# Patient Record
Sex: Male | Born: 1955 | Race: White | Hispanic: Yes | Marital: Married | Smoking: Never smoker
Health system: Southern US, Community
[De-identification: ages and names within clinical notes are randomized; demographics above are authoritative.]

## PROBLEM LIST (undated history)

## (undated) DIAGNOSIS — I2510 Atherosclerotic heart disease of native coronary artery without angina pectoris: Secondary | ICD-10-CM

## (undated) DIAGNOSIS — I2119 ST elevation (STEMI) myocardial infarction involving other coronary artery of inferior wall: Secondary | ICD-10-CM

## (undated) DIAGNOSIS — R945 Abnormal results of liver function studies: Secondary | ICD-10-CM

## (undated) DIAGNOSIS — I255 Ischemic cardiomyopathy: Secondary | ICD-10-CM

## (undated) DIAGNOSIS — E785 Hyperlipidemia, unspecified: Secondary | ICD-10-CM

## (undated) DIAGNOSIS — R570 Cardiogenic shock: Secondary | ICD-10-CM

## (undated) DIAGNOSIS — I10 Essential (primary) hypertension: Secondary | ICD-10-CM

## (undated) DIAGNOSIS — I739 Peripheral vascular disease, unspecified: Secondary | ICD-10-CM

## (undated) DIAGNOSIS — E1169 Type 2 diabetes mellitus with other specified complication: Secondary | ICD-10-CM

## (undated) DIAGNOSIS — E1151 Type 2 diabetes mellitus with diabetic peripheral angiopathy without gangrene: Secondary | ICD-10-CM

## (undated) HISTORY — DX: Type 2 diabetes mellitus with other specified complication: E11.69

## (undated) HISTORY — DX: Type 2 diabetes mellitus with diabetic peripheral angiopathy without gangrene: E11.51

## (undated) HISTORY — DX: Hyperlipidemia, unspecified: E78.5

## (undated) HISTORY — PX: CARDIAC CATHETERIZATION: SHX172

---

## 2011-09-13 ENCOUNTER — Other Ambulatory Visit: Payer: Self-pay

## 2011-09-13 ENCOUNTER — Encounter (HOSPITAL_COMMUNITY)
Admission: EM | Disposition: A | Discharge status: Home / Self Care | Payer: Self-pay | Source: Home / Self Care | Attending: Cardiology

## 2011-09-13 ENCOUNTER — Encounter (HOSPITAL_COMMUNITY): Payer: Self-pay | Admitting: Emergency Medicine

## 2011-09-13 ENCOUNTER — Ambulatory Visit (HOSPITAL_COMMUNITY): Admit: 2011-09-13 | Payer: Self-pay | Admitting: Cardiology

## 2011-09-13 ENCOUNTER — Inpatient Hospital Stay (HOSPITAL_COMMUNITY)
Admission: EM | Admit: 2011-09-13 | Discharge: 2011-09-20 | DRG: 248 | Disposition: A | Discharge status: Home / Self Care | Payer: MEDICAID | Attending: Cardiology | Admitting: Cardiology

## 2011-09-13 DIAGNOSIS — R570 Cardiogenic shock: Secondary | ICD-10-CM | POA: Diagnosis present

## 2011-09-13 DIAGNOSIS — I739 Peripheral vascular disease, unspecified: Secondary | ICD-10-CM | POA: Diagnosis present

## 2011-09-13 DIAGNOSIS — Z9189 Other specified personal risk factors, not elsewhere classified: Secondary | ICD-10-CM | POA: Diagnosis present

## 2011-09-13 DIAGNOSIS — I10 Essential (primary) hypertension: Secondary | ICD-10-CM | POA: Diagnosis present

## 2011-09-13 DIAGNOSIS — E119 Type 2 diabetes mellitus without complications: Secondary | ICD-10-CM | POA: Diagnosis present

## 2011-09-13 DIAGNOSIS — R112 Nausea with vomiting, unspecified: Secondary | ICD-10-CM | POA: Diagnosis present

## 2011-09-13 DIAGNOSIS — I2589 Other forms of chronic ischemic heart disease: Secondary | ICD-10-CM | POA: Diagnosis present

## 2011-09-13 DIAGNOSIS — I2510 Atherosclerotic heart disease of native coronary artery without angina pectoris: Secondary | ICD-10-CM | POA: Diagnosis present

## 2011-09-13 DIAGNOSIS — R7989 Other specified abnormal findings of blood chemistry: Secondary | ICD-10-CM | POA: Diagnosis present

## 2011-09-13 DIAGNOSIS — I2119 ST elevation (STEMI) myocardial infarction involving other coronary artery of inferior wall: Secondary | ICD-10-CM

## 2011-09-13 DIAGNOSIS — R945 Abnormal results of liver function studies: Secondary | ICD-10-CM | POA: Diagnosis present

## 2011-09-13 DIAGNOSIS — I70209 Unspecified atherosclerosis of native arteries of extremities, unspecified extremity: Secondary | ICD-10-CM | POA: Diagnosis present

## 2011-09-13 DIAGNOSIS — I213 ST elevation (STEMI) myocardial infarction of unspecified site: Secondary | ICD-10-CM | POA: Diagnosis present

## 2011-09-13 DIAGNOSIS — I9589 Other hypotension: Secondary | ICD-10-CM | POA: Diagnosis present

## 2011-09-13 DIAGNOSIS — I255 Ischemic cardiomyopathy: Secondary | ICD-10-CM | POA: Diagnosis present

## 2011-09-13 DIAGNOSIS — Z8249 Family history of ischemic heart disease and other diseases of the circulatory system: Secondary | ICD-10-CM

## 2011-09-13 HISTORY — PX: LEFT HEART CATHETERIZATION WITH CORONARY ANGIOGRAM: SHX5451

## 2011-09-13 HISTORY — PX: PERCUTANEOUS CORONARY STENT INTERVENTION (PCI-S): SHX5485

## 2011-09-13 HISTORY — DX: Ischemic cardiomyopathy: I25.5

## 2011-09-13 HISTORY — DX: Essential (primary) hypertension: I10

## 2011-09-13 HISTORY — DX: Cardiogenic shock: R57.0

## 2011-09-13 HISTORY — DX: Atherosclerotic heart disease of native coronary artery without angina pectoris: I25.10

## 2011-09-13 HISTORY — DX: Abnormal results of liver function studies: R94.5

## 2011-09-13 HISTORY — DX: Peripheral vascular disease, unspecified: I73.9

## 2011-09-13 HISTORY — PX: CORONARY ANGIOPLASTY WITH STENT PLACEMENT: SHX49

## 2011-09-13 HISTORY — DX: ST elevation (STEMI) myocardial infarction involving other coronary artery of inferior wall: I21.19

## 2011-09-13 LAB — COMPREHENSIVE METABOLIC PANEL
AST: 232 U/L — ABNORMAL HIGH (ref 0–37)
Albumin: 3.8 g/dL (ref 3.5–5.2)
Alkaline Phosphatase: 85 U/L (ref 39–117)
BUN: 27 mg/dL — ABNORMAL HIGH (ref 6–23)
CO2: 26 mEq/L (ref 19–32)
Chloride: 95 mEq/L — ABNORMAL LOW (ref 96–112)
Creatinine, Ser: 1.03 mg/dL (ref 0.50–1.35)
GFR calc non Af Amer: 79 mL/min — ABNORMAL LOW (ref >90)
Potassium: 4.6 mEq/L (ref 3.5–5.1)
Total Bilirubin: 0.4 mg/dL (ref 0.3–1.2)

## 2011-09-13 LAB — URINALYSIS, ROUTINE W REFLEX MICROSCOPIC
Bilirubin Urine: NEGATIVE
Glucose, UA: >1000 mg/dL — AB
Ketones, ur: NEGATIVE mg/dL
Protein, ur: NEGATIVE mg/dL
pH: 5 (ref 5.0–8.0)

## 2011-09-13 LAB — GLUCOSE, CAPILLARY
Glucose-Capillary: 362 mg/dL — ABNORMAL HIGH (ref 70–99)
Glucose-Capillary: 258 mg/dL — ABNORMAL HIGH (ref 70–99)

## 2011-09-13 LAB — CBC WITH DIFFERENTIAL/PLATELET
Basophils Absolute: 0 10*3/uL (ref 0.0–0.1)
Basophils Relative: 0 % (ref 0–1)
HCT: 33.8 % — ABNORMAL LOW (ref 39.0–52.0)
Hemoglobin: 11.9 g/dL — ABNORMAL LOW (ref 13.0–17.0)
Lymphocytes Relative: 11 % — ABNORMAL LOW (ref 12–46)
MCHC: 35.2 g/dL (ref 30.0–36.0)
Monocytes Absolute: 0.8 10*3/uL (ref 0.1–1.0)
Monocytes Relative: 9 % (ref 3–12)
Neutro Abs: 7.1 10*3/uL (ref 1.7–7.7)
Neutrophils Relative %: 80 % — ABNORMAL HIGH (ref 43–77)
WBC: 8.8 10*3/uL (ref 4.0–10.5)

## 2011-09-13 LAB — POCT I-STAT TROPONIN I

## 2011-09-13 LAB — POCT I-STAT, CHEM 8
Calcium, Ion: 1.17 mmol/L (ref 1.12–1.32)
Creatinine, Ser: 1.2 mg/dL (ref 0.50–1.35)
Glucose, Bld: 404 mg/dL — ABNORMAL HIGH (ref 70–99)
Hemoglobin: 13.3 g/dL (ref 13.0–17.0)
Potassium: 4.6 mEq/L (ref 3.5–5.1)
TCO2: 26 mmol/L (ref 0–100)

## 2011-09-13 LAB — MRSA PCR SCREENING: MRSA by PCR: NEGATIVE

## 2011-09-13 LAB — URINE MICROSCOPIC-ADD ON

## 2011-09-13 SURGERY — LEFT HEART CATHETERIZATION WITH CORONARY ANGIOGRAM
Anesthesia: LOCAL | Site: Groin

## 2011-09-13 MED ORDER — HEPARIN BOLUS VIA INFUSION
4000.0000 [IU] | Freq: Once | INTRAVENOUS | Status: AC
  Administered 2011-09-13: 4000 [IU] via INTRAVENOUS

## 2011-09-13 MED ORDER — MIDAZOLAM HCL 2 MG/2ML IJ SOLN
INTRAMUSCULAR | Status: AC
  Filled 2011-09-13: qty 2

## 2011-09-13 MED ORDER — SODIUM CHLORIDE 0.9 % IV SOLN
0.2500 mg/kg/h | INTRAVENOUS | Status: AC
  Administered 2011-09-13: 0.249 mg/kg/h via INTRAVENOUS
  Filled 2011-09-13: qty 250

## 2011-09-13 MED ORDER — ASPIRIN 81 MG PO CHEW
81.0000 mg | CHEWABLE_TABLET | Freq: Every day | ORAL | Status: DC
  Administered 2011-09-14 – 2011-09-20 (×7): 81 mg via ORAL
  Filled 2011-09-13 (×6): qty 1

## 2011-09-13 MED ORDER — TICAGRELOR 90 MG PO TABS
90.0000 mg | ORAL_TABLET | Freq: Two times a day (BID) | ORAL | Status: DC
  Administered 2011-09-14 – 2011-09-20 (×13): 90 mg via ORAL
  Filled 2011-09-13 (×14): qty 1

## 2011-09-13 MED ORDER — ATORVASTATIN CALCIUM 80 MG PO TABS
80.0000 mg | ORAL_TABLET | Freq: Every day | ORAL | Status: DC
  Administered 2011-09-13 – 2011-09-20 (×8): 80 mg via ORAL
  Filled 2011-09-13 (×9): qty 1

## 2011-09-13 MED ORDER — DOPAMINE-DEXTROSE 3.2-5 MG/ML-% IV SOLN
INTRAVENOUS | Status: AC
  Filled 2011-09-13: qty 250

## 2011-09-13 MED ORDER — LIDOCAINE HCL (PF) 1 % IJ SOLN
INTRAMUSCULAR | Status: AC
  Filled 2011-09-13: qty 30

## 2011-09-13 MED ORDER — SODIUM CHLORIDE 0.9 % IV SOLN
1.0000 mL/kg/h | INTRAVENOUS | Status: AC
  Administered 2011-09-13: 1 mL/kg/h via INTRAVENOUS

## 2011-09-13 MED ORDER — ACETAMINOPHEN 325 MG PO TABS
650.0000 mg | ORAL_TABLET | ORAL | Status: DC | PRN
  Filled 2011-09-13: qty 2

## 2011-09-13 MED ORDER — MORPHINE SULFATE 2 MG/ML IJ SOLN
1.0000 mg | INTRAMUSCULAR | Status: DC | PRN
  Administered 2011-09-13 – 2011-09-14 (×2): 1 mg via INTRAVENOUS
  Filled 2011-09-13: qty 1

## 2011-09-13 MED ORDER — ONDANSETRON HCL 4 MG/2ML IJ SOLN
4.0000 mg | Freq: Four times a day (QID) | INTRAMUSCULAR | Status: DC | PRN
  Administered 2011-09-13: 4 mg via INTRAVENOUS
  Filled 2011-09-13: qty 2

## 2011-09-13 MED ORDER — HEPARIN SODIUM (PORCINE) 5000 UNIT/ML IJ SOLN
INTRAMUSCULAR | Status: AC
  Filled 2011-09-13: qty 1

## 2011-09-13 MED ORDER — FUROSEMIDE 20 MG PO TABS
20.0000 mg | ORAL_TABLET | Freq: Two times a day (BID) | ORAL | Status: DC
  Filled 2011-09-13 (×3): qty 1

## 2011-09-13 MED ORDER — HEPARIN (PORCINE) IN NACL 2-0.9 UNIT/ML-% IJ SOLN
INTRAMUSCULAR | Status: AC
  Filled 2011-09-13: qty 2000

## 2011-09-13 MED ORDER — LISINOPRIL 5 MG PO TABS
5.0000 mg | ORAL_TABLET | Freq: Every day | ORAL | Status: DC
  Filled 2011-09-13: qty 1

## 2011-09-13 MED ORDER — FUROSEMIDE 10 MG/ML IJ SOLN
40.0000 mg | Freq: Once | INTRAMUSCULAR | Status: DC
  Filled 2011-09-13 (×2): qty 4

## 2011-09-13 MED ORDER — ONDANSETRON HCL 4 MG/2ML IJ SOLN
4.0000 mg | Freq: Once | INTRAMUSCULAR | Status: AC
  Administered 2011-09-13: 4 mg via INTRAVENOUS
  Filled 2011-09-13: qty 2

## 2011-09-13 MED ORDER — SODIUM CHLORIDE 0.9 % IJ SOLN: 3.0000 mL | INTRAMUSCULAR | Status: DC | PRN

## 2011-09-13 MED ORDER — FAMOTIDINE IN NACL 20-0.9 MG/50ML-% IV SOLN
INTRAVENOUS | Status: AC
  Filled 2011-09-13: qty 50

## 2011-09-13 MED ORDER — FUROSEMIDE 10 MG/ML IJ SOLN
INTRAMUSCULAR | Status: AC
  Filled 2011-09-13: qty 4

## 2011-09-13 MED ORDER — GLIPIZIDE 10 MG PO TABS
10.0000 mg | ORAL_TABLET | Freq: Two times a day (BID) | ORAL | Status: DC
  Administered 2011-09-14 – 2011-09-20 (×14): 10 mg via ORAL
  Filled 2011-09-13 (×17): qty 1

## 2011-09-13 MED ORDER — INSULIN GLARGINE 100 UNIT/ML ~~LOC~~ SOLN
5.0000 [IU] | Freq: Every day | SUBCUTANEOUS | Status: DC
  Administered 2011-09-13 – 2011-09-15 (×3): 5 [IU] via SUBCUTANEOUS

## 2011-09-13 MED ORDER — DOPAMINE-DEXTROSE 3.2-5 MG/ML-% IV SOLN
5.0000 ug/kg/min | INTRAVENOUS | Status: DC
  Administered 2011-09-13: 2.5 ug/kg/min via INTRAVENOUS
  Administered 2011-09-13 – 2011-09-15 (×2): 5 ug/kg/min via INTRAVENOUS
  Filled 2011-09-13: qty 250

## 2011-09-13 MED ORDER — ASPIRIN 81 MG PO CHEW
324.0000 mg | CHEWABLE_TABLET | Freq: Once | ORAL | Status: AC
  Administered 2011-09-13: 324 mg via ORAL
  Filled 2011-09-13: qty 4

## 2011-09-13 MED ORDER — NITROGLYCERIN 0.2 MG/ML ON CALL CATH LAB
INTRAVENOUS | Status: AC
  Filled 2011-09-13: qty 1

## 2011-09-13 MED ORDER — MORPHINE SULFATE 2 MG/ML IJ SOLN
INTRAMUSCULAR | Status: AC
  Administered 2011-09-13: 1 mg via INTRAVENOUS
  Filled 2011-09-13: qty 1

## 2011-09-13 MED ORDER — FENTANYL CITRATE 0.05 MG/ML IJ SOLN
INTRAMUSCULAR | Status: AC
  Filled 2011-09-13: qty 2

## 2011-09-13 MED ORDER — BIVALIRUDIN 250 MG IV SOLR
INTRAVENOUS | Status: AC
  Filled 2011-09-13: qty 250

## 2011-09-13 MED ORDER — SODIUM CHLORIDE 0.9 % IV BOLUS (SEPSIS)
1000.0000 mL | Freq: Once | INTRAVENOUS | Status: AC
  Administered 2011-09-13: 1000 mL via INTRAVENOUS

## 2011-09-13 MED ORDER — ALPRAZOLAM 0.25 MG PO TABS: 0.2500 mg | ORAL_TABLET | Freq: Two times a day (BID) | ORAL | Status: DC | PRN

## 2011-09-13 MED ORDER — SODIUM CHLORIDE 0.9 % IV SOLN
250.0000 mL | INTRAVENOUS | Status: DC
  Administered 2011-09-16: 1000 mL via INTRAVENOUS

## 2011-09-13 MED ORDER — TICAGRELOR 90 MG PO TABS
ORAL_TABLET | ORAL | Status: AC
  Filled 2011-09-13: qty 2

## 2011-09-13 MED ORDER — SODIUM CHLORIDE 0.9 % IJ SOLN
3.0000 mL | Freq: Two times a day (BID) | INTRAMUSCULAR | Status: DC
  Administered 2011-09-14 – 2011-09-19 (×9): 3 mL via INTRAVENOUS

## 2011-09-13 MED ORDER — SODIUM CHLORIDE 0.9 % IV SOLN
1.7500 mg/kg/h | INTRAVENOUS | Status: DC
  Administered 2011-09-13: 1.75 mg/kg/h via INTRAVENOUS
  Filled 2011-09-13: qty 250

## 2011-09-13 MED ORDER — FENTANYL CITRATE 0.05 MG/ML IJ SOLN
25.0000 ug | INTRAMUSCULAR | Status: DC | PRN
  Administered 2011-09-14: 25 ug via INTRAVENOUS
  Filled 2011-09-13: qty 2

## 2011-09-13 MED ORDER — INSULIN ASPART 100 UNIT/ML ~~LOC~~ SOLN
0.0000 [IU] | Freq: Three times a day (TID) | SUBCUTANEOUS | Status: DC
  Administered 2011-09-14: 2 [IU] via SUBCUTANEOUS
  Administered 2011-09-14: 7 [IU] via SUBCUTANEOUS
  Administered 2011-09-14: 3 [IU] via SUBCUTANEOUS
  Administered 2011-09-15: 2 [IU] via SUBCUTANEOUS
  Administered 2011-09-15: 3 [IU] via SUBCUTANEOUS
  Administered 2011-09-15: 5 [IU] via SUBCUTANEOUS
  Administered 2011-09-16: 2 [IU] via SUBCUTANEOUS
  Administered 2011-09-16 (×2): 3 [IU] via SUBCUTANEOUS
  Administered 2011-09-17: 5 [IU] via SUBCUTANEOUS
  Administered 2011-09-17 – 2011-09-18 (×3): 3 [IU] via SUBCUTANEOUS
  Administered 2011-09-18: 2 [IU] via SUBCUTANEOUS
  Administered 2011-09-18 – 2011-09-19 (×2): 5 [IU] via SUBCUTANEOUS
  Administered 2011-09-19: 1 [IU] via SUBCUTANEOUS
  Administered 2011-09-20: 2 [IU] via SUBCUTANEOUS
  Administered 2011-09-20: 1 [IU] via SUBCUTANEOUS
  Administered 2011-09-20: 2 [IU] via SUBCUTANEOUS

## 2011-09-13 NOTE — ED Notes (Signed)
Pt transported to cath lab.  

## 2011-09-13 NOTE — ED Provider Notes (Signed)
History     CSN: [id number]  Arrival date & time 09/13/11  1557   First MD Initiated Contact with Patient 09/13/11 1627      Chief Complaint  Patient presents with  . Nausea  . Emesis    (Consider location/radiation/quality/duration/timing/severity/associated sxs/prior treatment) Patient is a 56 y.o. male presenting with vomiting. The history is provided by the patient.  Emesis  This is a new problem. The current episode started more than 2 days ago. The problem occurs more than 10 times per day. The problem has not changed since onset.The emesis has an appearance of stomach contents. There has been no fever. Pertinent negatives include no abdominal pain, no chills, no cough, no diarrhea, no fever and no myalgias. Risk factors include suspect food intake.  Pt states unable to keep anything down for three days. States vomiting each time he eats or drinks anything. Reports left sided chest pain radiating to the back. No abdominal pain. Normal bowel movements, alst this am. denies fever, chills, SOB, dizziness. States feels weak.   Past Medical History  Diagnosis Date  . Diabetes mellitus     History reviewed. No pertinent past surgical history.  History reviewed. No pertinent family history.  History  Substance Use Topics  . Smoking status: Never Smoker   . Smokeless tobacco: Not on file  . Alcohol Use: No      Review of Systems  Constitutional: Positive for diaphoresis. Negative for fever and chills.  HENT: Negative.   Eyes: Negative.   Respiratory: Positive for chest tightness. Negative for cough, shortness of breath, wheezing and stridor.   Cardiovascular: Positive for chest pain. Negative for leg swelling.  Gastrointestinal: Positive for nausea and vomiting. Negative for abdominal pain, diarrhea and constipation.  Genitourinary: Negative for dysuria and flank pain.  Musculoskeletal: Positive for back pain. Negative for myalgias.  Skin: Positive for pallor.    Neurological: Positive for weakness. Negative for dizziness.    Allergies  Review of patient's allergies indicates not on file.  Home Medications  No current outpatient prescriptions on file.  BP 89/60  Pulse 66  Temp 99.8 F (37.7 C) (Oral)  Resp 16  SpO2 99%  Physical Exam  Nursing note and vitals reviewed. Constitutional: He is oriented to person, place, and time. He appears well-developed and well-nourished.       Appears ill, skin is clammy  HENT:  Head: Normocephalic.  Eyes: Conjunctivae are normal.  Cardiovascular: Normal rate, regular rhythm and normal heart sounds.   Pulmonary/Chest: Effort normal. No respiratory distress. He has no wheezes. He has no rales. He exhibits no tenderness.  Abdominal: Soft. Bowel sounds are normal. He exhibits no distension. There is no tenderness. There is no rebound.  Musculoskeletal: He exhibits no edema.  Neurological: He is alert and oriented to person, place, and time.  Skin:       Clammy, cool  Psychiatric: He has a normal mood and affect.    ED Course  Procedures (including critical care time)   4:48 PM Pt with persistent nausea/vomiting, left chest pain on and off for 3 days. BP 89/60 on the monitor, pt AAOx3, skin is clammy. Pt's ECG shows some ST elevation/depressions. Dr. Karma Ganja seen pt, called cardiology for possible code stemi. ASA ordered. Pt has no cardiac hx, from [place]. Fluids started, labs pending. Will minotor closely.    Date: 09/14/2011  Rate: 66  Rhythm: normal sinus rhythm  QRS Axis: normal  Intervals: normal  ST/T Wave abnormalities: ST elevations  inferiorly  Conduction Disutrbances:none  Narrative Interpretation:   Old EKG Reviewed: none available   STEMI called, taken to cath lab.   1. STEMI (ST elevation myocardial infarction)       MDM          Lottie Mussel, PA 09/14/11 0127

## 2011-09-13 NOTE — ED Notes (Signed)
Pt given 2 boluses of NS.  3rd liter given at 500/hr.

## 2011-09-13 NOTE — ED Notes (Signed)
Called [facility] to page out a STEMI

## 2011-09-13 NOTE — ED Notes (Signed)
Pt reports Saturday morning with N/V. Pt unable to tolerate PO food or fluids since Saturday morning. Pt denies abdominal pain.

## 2011-09-13 NOTE — H&P (Signed)
History and Physical Interval Note:  NAME:  Scott Ingram   MRN: [id number] DOB:  02-29-1956   ADMIT DATE: 09/13/2011   09/13/2011 5:07 PM  Scott Ingram is a 56 y.o. male with a PMH of DM who started having GI upset with N/V starting after eating out Friday PM.  He continues to have difficulty keeping anything down today.  Today, however, he started noting chest tightness and shortness of breath along with his GI upset.  He presented to [facility] ER this PM through Triage, and his initial ECG demonstrates ~1-1.12mm STE in leads II, II, aVF with biphasic TWI as well as TWI with flat ST segments in V5-V6. There are reciprocal changes noted in I and aVL.  Upon arrival, his BP was in the low 80s - now in the 90s after 1.5L NS. He is actually not in significant distress with less severe chest discomfort ~3/10 on my exam. I was called for Code STEMI   Past Medical History  Diagnosis Date  . Diabetes mellitus    History reviewed. No pertinent past surgical history.  FAMHx: Hist brother just had an MI - he is in his 21s.  SOCHx:  reports that he has never smoked. He does not have any smokeless tobacco history on file. He reports that he does not drink alcohol or use illicit drugs.  ALLERGIES: Allergies not on file  HOME MEDICATIONS:  Doses Pending. Glipizide, Metformin and Lisinopril (unsure of doses) PRN Ibuprofen.  PHYSICAL EXAM:Blood pressure 90/61, pulse 66, temperature 99.8 F (37.7 C), temperature source Oral, resp. rate 16, SpO2 99.00%. General appearance: alert, cooperative, appears stated age, mild distress, pale and pleasant.  Normal mood & affect. Neck: no adenopathy, no carotid bruit, no JVD, supple, symmetrical, trachea midline and thyroid not enlarged, symmetric, no tenderness/mass/nodules Lungs: non-labored, faint RLL rales; otherwise CTAB Heart: regular rate and rhythm, S1, S2 normal, no murmur, click, rub or gallop Abdomen: soft, mildly tender, ND, increased  BS Extremities: extensive scabs/scars on B Legs, no C/C/E Pulses: 2+ throughout, but thready with hypotension Skin: see above for LE Neurologic: Grossly normal; CN Grossly intact.  IMPRESSION & PLAN Scott Ingram has presented with InferoLateral STEMI and hypotension & + Troponin The plan is to proceed with Emergent Cardiac Catheterization.    The various methods of treatment have been discussed with the patient and family.   Risks / Complications include, but not limited to: Death, MI, CVA/TIA, VF/VT (with defibrillation), Bradycardia (need for temporary pacer placement), contrast induced nephropathy,  bleeding / bruising / hematoma / pseudoaneurysm, vascular or coronary injury (with possible emergent CT or Vascular Surgery), adverse medication reactions, infection.     After consideration of risks, benefits and other options for treatment, the patient has consented to Procedure(s):  LEFT HEART CATHETERIZATION AND CORONARY ANGIOGRAPHY +/- AD HOC PERCUTANEOUS CORONARY INTERVENTION  as a surgical intervention.   We will proceed with the planned procedure.   Taje Tondreau W [facility] [address] [place], [place]  [address]  [phone]  09/13/2011 5:07 PM

## 2011-09-13 NOTE — CV Procedure (Addendum)
[facility]     CARDIAC CATHETERIZATION REPORT  NAME:  Scott Ingram     MRN: [id number] DATE OF BIRTH:  1955/12/27   ADMIT DATE:  09/13/2011  Performing Cardiologist: Marykay Lex, M.D., M.S. Primary Physician: Unknown Primary Cardiologist:  None  Procedures Performed:  Left Heart Catheterization via 6 Fr Left Common Femoral access  Left Ventriculography, (RAO) 12 ml/sec for 25 ml total contrast  Native Coronary Angiography  IC NTG Injection  Aspiration Thrombectomy of the mid to distal RCA using the Xpressway Aspiration Catheter   Very difficult, Complex Two lesion Percutaneous Coronary Artery Intervention on  distal and mid RCA with a  Integrity Bare-Metal Stents --  3.0 mm x 18 mm distal (final diameter  3.3 mm),  3.5 mm x 22 mm proximal (final diameter 3.7 mm in the distal stent, 4. 2 mm at the beginning of the stent)  Distal Abdominal Aorto-biiliac Angiography  Indication(s): Inferior and Lateral ST elevation MI  Hypotension consistent with Cardiogenic Shock  Diabetes Type 2  Hypertension  History: 56 y.o. male  with history of Diabetes Type 2 and Hypertension who is here in town from [place] visiting his son. He was in his usual state of health until Friday (June 28) evening after eating to a restaurant, when he began to have an severe gastrointestinal problems with nausea and vomiting. Since then he's been unable to keep anything down including V8 juice and Gatorade.   He has gotten persistently worse until today he started noting pressure in his chest, found it hard to breathe, and was very weak. His son brought her to [facility] emergency room at roughly 1620 hours.  During triage and initial ECG at 1633 hours was performed that demonstrated subtle, 0.5-1 mm ST elevations with biphasic T waves in leads 23 and aVF as well as V4 with ST depressions in 1 and aVL. Also noted were Anteroseptal Q waves with anterolateral T wave inversions  in V5 and V6 with subtle ST elevations in lead V3. His initial Vital Signs show a blood pressure of 74/52 mmHg that improved to 105/66 mmHg after nearly 2 L of normal saline infusion.  Code STEMI was called at roughly 1640 hour. Due to his persistent symptoms of chest pressure and diaphoresis with hypotension, he was taken emergently to the cardiac catheterization lab.  He arrived in the cardiac apposition lab at 1717 hours.  Consent: While in the emergency room, the procedure with Risks/Benefits/Alternatives and Indications was reviewed with the patient using his son as interpreter from [place] to [place].  All questions were answered.    Risks / Complications include, but not limited to: Death, MI, CVA/TIA, VF/VT (with defibrillation), Bradycardia (need for temporary pacer placement), contrast induced nephropathy, bleeding / bruising / hematoma / pseudoaneurysm, vascular or coronary injury (with possible emergent CT or Vascular Surgery), adverse medication reactions, infection.    The patient and his son voiced understanding and agree to proceed.  Verbal consent was obtained.  Procedure: The patient was brought to the 2nd Floor [facility] in the fasting state and prepped and draped in the usual sterile fashion for Both Right and Left groin access. Sterile technique was used including antiseptics, cap, gloves, gown, hand hygiene, mask and sheet.  Skin prep: Chlorhexidine;  Time Out: Verified patient identification, verified procedure, site/side was marked, verified correct patient position, special equipment/implants available, medications/allergies/relevent history reviewed, required imaging and test results available.  Performed  Initially, the right femoral  head was identified using tactile and fluoroscopic technique.  The right groin was anesthetized with 1% subcutaneous Lidocaine.  After several attempts using the standard needle to gain access to the right common  femoral artery, the ultrasound guided Smart Needle was also used to attempt to gain access but remain unsuccessful.  At this time the right groin was abandoned, and attention was turned to the left groin. The left femoral head was identified using tactile and fluoroscopic technique.  The left groin was anesthetized with 1% subcutaneous Lidocaine. The left Common Femoral Artery was accessed using the Modified Seldinger Technique with placement of a antimicrobial bonded/coated single lumen (6 Fr) sheath.  The sheath was aspirated and flushed.    First a 6 Fr JL4 followed by a JR 4 Catheter were advanced of over a Standard J-wire into the ascending Aorta.  The catheter was used to engage the Left then Right Coronary Arteries.  Multiple cineangiographic views of the  both Coronary Artery systems were performed.   The JR 4 catheter was then exchanged over the  standard J wire for an angled Pigtail catheter that was advanced across the Aortic Valve.  LV hemodynamics were measured, and Left Ventriculography was performed.  LV hemodynamics were then re-sampled, and the catheter was pulled back across the Aortic Valve for measurement of "pull-back" gradient.  The catheter was removed completely out of the body.  PCI procedure performed as noted below.  Due to the profound difficulty with Right Femoral Artery access, after completion of the PCI procedure, the Guide catheter was then exchanged over the standard J wire for an angled Pigtail catheter that was advanced to just above the iliac bifurcation, and a distal aortoiliac angiogram was performed with 20 mL of contrast over 1 second.  Hemodynamics:  Central Aortic Pressure / Mean Aortic Pressure: Initial pressures upon arrival were 98/65 mmHg.  As the case continue, pressure dropped to the 70s/50s mmHg -- Dopamine infusion was initiated at 5 mcg/min, then titrated up to 7.5 mcg/min. At the termination of the case the rate was set at 2.5 mcg/min.  LV Pressure  / LV End diastolic Pressure:  98/18 mmHg; 32 mmHg  Left Ventriculography:  EF:  Roughly 25%  Wall Motion: Diffuse global hypokinesis with near akinesis of the entire inferior wall to the apex.  Coronary Angiographic Data: Right Dominant  Left Main:  Large caliber that gives off a 100% ostial occluded LAD and the Left Circumflex  Left Anterior Descending (LAD):  100% ostial occlusion.  At least 2 diagonal branches fill via OM-Diag collaterals.  POST PCI - brisk septal collaterals are noted to the LAD.  Circumflex (LCx):  Large Caliber vessel that gives off a small "OM/Ramus" branch that provides collateral to a diagonal, a second small branch also provides collaterals to a different diagonal.  The main vessel then bifurcates into the AV Groove vessel and OM1.  The "ostium" of the AVGroove vessel from OM1 has a ~60% stenosis.  1st obtuse marginal:  Moderate to Large caliber, extensive branch that covers a large anterolateral territory.  It has diffuse ~20% stenoses and terminates as a small, tortuous vessel reaches also the apex.  The AVGroove vessel, after the initial stenosis then gives off OM2 before terminating as a small  AV groove branch that has a ~95-99% near subtotal occlusion (1.24mm vessel) before bifurcating into 2 very small Left Posterolateral (LPL) branches.  2nd obtuse marginal:  Moderate caliber with diffuse luminal irregularities, it bifurcates into to 2 small  branches, the inferior branch terminates the diffuse distal disease. The superior branch reaches almost to the apex   Right Coronary Artery: Large caliber dominant vessel that gives off the SA nodal artery and atrial branch and then at the second lateral band begins to taper to 100% occlusion at the take off of an RV marginal branch. There is dye hangup consistent with thrombus at the lesion. Post-balloon angioplasty and thrombectomy:  After initial reperfusion with a wire and balloon the original lesion was a heavily  calcified requiring balloon inflation simply to advance the thrombectomy catheter. Post thrombectomy reveal a residual 70% irregular lesion at band 3 as well as a distal 70-80% lesion before the vessel normalized just prior to the bifurcation into the Posterior Descending Artery (PDA) and the Right Posterior Atrioventricular Groove Branch (RPAVB) .  PDA: A small to moderate caliber with a proximal 80% lesion before normalizing into the vessel reaches almost to the apex giving off several small branches. Post PCI angiography reveals extensive septal collateralization to the LAD leading to perfusion almost of the LAD from a proximal occlusion point.  RPAVB:  Moderate caliber vessel that gives off 2 major and 3 small right posterior lateral branches as well as the AV nodal artery.    RPL 1 has diffuse lesionsranging from 40-60%.  Terminates in Dr. bifurcating in reaches well across the inferolateral wall covering a large territory  RPL 2 has minimal luminal irregularities but covers a lesser territory than RPL 1  AortoIliac Angiogram:  The distal Aorta is normal diameter, no significant lesions  Right Common Iliac - Ostial near sub-total occlusion with ~90+ % stenosis resulting in minimal flow during the imaged sequence.  Left Common --> External Iliac artery is normal in diameter with minimal luminal irregularities.  Internal Iliac - appears to be normal to the extent visualized.  Common Femoral artery - appears to be normal to the extent visualized.  PERCUTANEOUS CORONARY INTERVENTION PROCEDURE  After reviewing the initial cineangiography images, the mid RCA 100% occlusion was  felt to be  the culprit lesion  , and the decision was made to proceed with percutaneous coronary intervention.  A weight based bolus of IV Angiomax was administered and the drip was continued until completion of the procedure with the plan to continue for 2.5 hours following the procedure at the reduced rate of 0.25  mcg/Kg/min).  An ACT of > 200 Sec was confirmed prior to advancing the Guidewire. Oral Clopidogrel 600 mg was administered.   Initially a No Torque guidewire was chosen; however this did not provide adequate "back-up support"to allow for passing the wire beyond the lesion. Therefore, this guide was exchanged for an XB- RCA Guide catheter that was advanced over a J-wire and used to engage the right Coronary Artery.  After successful engaging the RCA, multiple attempts to pass the wire beyond the occlusion were unsuccessful, so the initial predilation balloon was advanced along with the wire to provide support. With the support, the wire then balloon were advanced well into the distal RCA. With the wire balloon placed angiography revealed minimal distal flow beyond the original lesion.  Multiple inflations with initial predilatation balloon were performed at the original lesion. Despite these inflations, TIMI-0 flow was noted beyond the lesion.  Therefore, 2 passes were made with the Aspiration Thrombectomy Catheter and TIMI-3 flow was established distally as described in the findings above.  Lesions --  #1:  Distal RCA ulcerated lesion;  Culprit Lesion #2: Mid RCA 100% calcified/thrombotic  Occlusion Site  Pre-PCI Stenosis:  70-80 %  Post-PCI Stenosis:  0 %     TIMI  0 flow       TIMI  3 flow  Guide Catheter:  XB RCA; with Guide-liner catheter support  Guidewires:  BMW -- initial balloon angioplasty and aspiration thrombectomy    Extra support (Sport) wire -- for the remainder of the intervention  Initial predilation at Lesion #2  Pre-Dilitation Balloon #1: Emerge Monorail 2.5 mm x 12 mm  3 Inflations:  10 Atm for 30 Sec -- from proximal to distal at the occlusion site Scout angiography revealed minimal flow beyond the occlusion with evidence of thrombus throughout the distal vessel.    Xpressway Aspiration Thrombectomy Catheter: 2 full length passes made from mid to distal RCA Scout angiography  revealed restoration of TIMI-3 flow throughout the distal vessel. (This was not done under Cineangiography and is therefore not recorded on the film images.) At this time event was made pass the initial stent into the distal lesion, however this was unsuccessful therefore he a second predilatation balloon was used.  Pre-Dilitation Balloon #2: Emerge Monorail 3.0 mg 15 mm  1st Inflation:  10 Atm for 46 Sec    Despite this predilation, the stent was still not pass beyond the initial lesion. At this time the second wire [employer]) was advanced into the distal RCA/RPL system to be used as the were work-horse wire.  Get again attempted to advance the stent to the distal vessel was unsuccessful. A third predilatation balloon was used.  Pre-Dilitation Balloon #3: [place] Quantum Apex Monorail 3.0 mm x 15 mm  3 Inflations:  12 Atm for 45 Sec -- from proximal to distal at the occlusion site.  Again, the stent would not pass distally, therefore the Guide-liner catheter was advanced over pre-dilation balloon #3 with much difficulty down beyond the initial occlusion site. Then with much difficulty the guide was repositioned at the ostium of the RCA, and the balloon was successfully removed.   At this time stent was advanced through the Guide-liner to Lesion #1 4 direct stenting.  Also at this time the Dopamine infusion was initiated for hypotension with systolic blood pressure back in the 70 mm Hg range   STENT # 1/Lesion #1 :  Integrity BMS 3.0 mm x 18 mm  Deployment:  12 Atm for  30 Sec  Post-Inflation:  14 Atm for  46 Sec -- final diameter 3.2 mm Scout angiography revealed no dissection or perforation post-deployment.   Post-Dilitation Balloon for Lesion #1:  [place] Quantum Apex Monorail 3.25 mm x 12 mm  1st Inflation:  14 Atm for  47 Sec; final diameter in the proximal portion of stent 3.25 mm   2nd Inflation:  16 Atm for  45 Sec; final diameter in the mid to distal portion of stent 3.3 mm    Post-deployment cineangiography with and without guidewire in place was performed in multiple orthogonal views demonstrating  excellent stent deployment and did not reveal evidence of dissection or perforation. Attention was then turned back to Lesion #2   STENT # 2/Lesion #2 :  Integrity BMS 3.5 mm x 22 mm  1st Inflation:  12 Atm for  60 Sec  2nd Inflation:   16 Atm for  33 Sec; final diameter in the distal portion of the stent -- 3.7 mm   Post-Dilitation Balloon:  [place] Quantum Apex Monorail 4.0 mm 12 mm   Intracoronary Nitroglycerin 200 mcg   1st Inflation:  12 Atm for  50  Sec; mid to distal stent segment--final diameter 4.1 mm  2nd Inflation:  13  Atm for  60  Sec; mid stent at the original occlusion site  3rd Inflation:  16 Atm for  60  Sec; proximal to mid stent segment -- final diameter 4.25 mm  Intracoronary Nitroglycerin 200 mcg At this time, the stent balloon and guide liner were removed completely out of by, while maintaining the catheter at the ostium of the vessel.  Post-deployment cineangiography with and without guidewire in place was performed in multiple orthogonal views demonstrating  excellent  stent deployment  in both locations and did not reveal evidence of dissection or perforation.  Excellent brisk TIMI-3 flow was successfully restored throughout the entire RCA distribution as described in the findings above.   The sheath was exchanged for a 6 [place] to a 7 [place] sheath as there had been significant play with the initial sheath leading to a small hematoma that was quite tender. The sheath was inserted in place. The patient was transferred to the CCU where the sheath was removed (2 hours following Angiomax discontinuation) with manual pressure held for hemostasis.    The patient was transported to the CCU in a chest pain-free, hemodynamically stable condition.   The patient  was stable before, during and following the procedure.  Although his systolic blood  pressure did drop into the 70s the patient remained stable. Patient did tolerate procedure well. There were not complications. EBL: <20  Medications:  Premedication: 5000 Units IV Heparin, 324 mg Aspirin, 2 L Normal Saline   Sedation:   6 mg IV Versed,  100 mcg IV Fentanyl  Contrast:   290 mL Omnipaque  Anticoagulation:  Angiomax bolus (0.75 mg/Kg) followed by infusion at 1.75 mg/Kg/hour until completion of the intervention, then decreased to 0.25 mg/Kg/hour to be continued for 2.5 hours after the intervention.   Clopidogrel 600 mg by mouth  Pepcid 20 mg IV  Dopamine infusion -- initiated at 5 mcg/min, titrated up to 7.5 mcg/min temporarily then weaned back to 2.5 mcg/min and continued after completion of the procedure.  Furosemide 40 mg IV  Impression:  Severe Native Coronary Disease with the culprit lesion being the 100%, calcified, thrombotic occlusion of the mid RCA in the setting of near ostial occlusion of the LAD and diffuse small vessel disease of the Circumflex system as well as the distal RCA system as noted post-PCI.   Status-post successful very complex/difficult PCI of the mid and distal RCA with Aspiration Thrombectomy, high pressure Noncompliant Balloon Predilation, the Guide-Liner catheter as well as extra strength Support Wire usage.  Severe Ischemic Cardiomyopathy with an Ejection Fraction of roughly 25% with severely elevated EP of 32 mmHg.  Severe, near subtotal occlusion of the ostium of the Right Common Iliac Artery.  Cardiogenic shock on arrival, nearly resolved post PCI.  Plan:  The patient will be admitted to the CCU where the Angiomax infusion we'll continue as described above.    Continue the Dopamine infusion throughout the course of the evening during post catheterization hydration and Lasix.  Re- dose him with IV Lasix 6 hours post procedure.    Place Foley catheter for the evening.  Continue dual and antiplatelet therapy, however I will  switch him to Brilinta while inpatient with the thought that he will be able to obtain at least the first month free of cost. At that time he could continue with either Brilinta, Plavix or Prasugrel depending on  his financial status. I will obtain care manager consultation to assist with this.  I will initiate reinitiate his ACE inhibitor in the morning, and once successfully weaned off of the dopamine infusion will initiate beta blocker therapy.  Initiate statin therapy.  He was hyperglycemic on initial evaluation with a glucose of 430, he'll be treated with Lantus and sliding scale insulin for the duration in addition to his oral hypoglycemic. His metformin will be held for 72 hours.  I will check a post PCI echocardiogram prior to discharge. We will make CAD copies of his echocardiogram and angiography for him to carry with him back to [facility].   The case and results was discussed with the patient  and his son with the images reviewed.   Time Spend Directly with Patient:   120 minutes  Scott Ingram, M.D., M.S. [facility] [address] [place], [place]  [address]  [phone]   ADDENDUM:  Mr. Flanigan was found to have an occluded Right Common Iliac Artery which was the reason for the difficulty with RFA access despite direct ultrasound guidance.  Eventually, access was obtained on the Left Common Femoral Artery.  Additionally, the RCA was very tortuous vessel with a very difficult lesion to cross requiring a second wire.  PCI was therefore delayed due to extreme difficulty with arterial access as well as difficulty crossing the lesion with guidewire. Thankfully, despite being in shock, the patient was maintained with IV inotropic treatment and remained chest pain free.  Marykay Lex, M.D., M.S. [facility] [address] [place], [place]  [address]  [phone] Pager #  [phone]  10/19/2011 9:43 AM

## 2011-09-14 ENCOUNTER — Inpatient Hospital Stay (HOSPITAL_COMMUNITY): Payer: Self-pay

## 2011-09-14 ENCOUNTER — Encounter (HOSPITAL_COMMUNITY): Payer: Self-pay | Admitting: Cardiology

## 2011-09-14 DIAGNOSIS — I255 Ischemic cardiomyopathy: Secondary | ICD-10-CM | POA: Diagnosis present

## 2011-09-14 DIAGNOSIS — Z8249 Family history of ischemic heart disease and other diseases of the circulatory system: Secondary | ICD-10-CM

## 2011-09-14 DIAGNOSIS — I213 ST elevation (STEMI) myocardial infarction of unspecified site: Secondary | ICD-10-CM | POA: Diagnosis present

## 2011-09-14 DIAGNOSIS — I10 Essential (primary) hypertension: Secondary | ICD-10-CM | POA: Diagnosis present

## 2011-09-14 DIAGNOSIS — E119 Type 2 diabetes mellitus without complications: Secondary | ICD-10-CM | POA: Diagnosis present

## 2011-09-14 HISTORY — PX: OTHER SURGICAL HISTORY: SHX169

## 2011-09-14 LAB — BASIC METABOLIC PANEL
GFR calc non Af Amer: >90 mL/min (ref >90)
Glucose, Bld: 339 mg/dL — ABNORMAL HIGH (ref 70–99)
Potassium: 3.9 mEq/L (ref 3.5–5.1)
Sodium: 137 mEq/L (ref 135–145)

## 2011-09-14 LAB — POCT ACTIVATED CLOTTING TIME
Activated Clotting Time: 364 seconds
Activated Clotting Time: 139 seconds

## 2011-09-14 LAB — CARDIAC PANEL(CRET KIN+CKTOT+MB+TROPI)
CK, MB: 20.1 ng/mL (ref 0.3–4.0)
Relative Index: 1.1 (ref 0.0–2.5)
Total CK: 1382 U/L — ABNORMAL HIGH (ref 7–232)

## 2011-09-14 LAB — LIPID PANEL
Cholesterol: 157 mg/dL (ref 0–200)
HDL: 49 mg/dL (ref >39)
LDL Cholesterol: 92 mg/dL (ref 0–99)
Triglycerides: 82 mg/dL (ref <150)
VLDL: 16 mg/dL (ref 0–40)

## 2011-09-14 LAB — CBC
Hemoglobin: 12 g/dL — ABNORMAL LOW (ref 13.0–17.0)
MCHC: 35.3 g/dL (ref 30.0–36.0)
RBC: 3.87 MIL/uL — ABNORMAL LOW (ref 4.22–5.81)

## 2011-09-14 LAB — GLUCOSE, CAPILLARY: Glucose-Capillary: 200 mg/dL — ABNORMAL HIGH (ref 70–99)

## 2011-09-14 MED ORDER — FUROSEMIDE 20 MG PO TABS
20.0000 mg | ORAL_TABLET | Freq: Two times a day (BID) | ORAL | Status: DC
  Administered 2011-09-15: 20 mg via ORAL
  Filled 2011-09-14 (×3): qty 1

## 2011-09-14 MED ORDER — LISINOPRIL 2.5 MG PO TABS
2.5000 mg | ORAL_TABLET | Freq: Every day | ORAL | Status: DC
  Filled 2011-09-14: qty 1

## 2011-09-14 MED ORDER — ZOLPIDEM TARTRATE 5 MG PO TABS: 10.0000 mg | ORAL_TABLET | Freq: Every evening | ORAL | Status: DC | PRN

## 2011-09-14 MED FILL — Dextrose Inj 5%: INTRAVENOUS | Qty: 50 | Status: AC

## 2011-09-14 NOTE — Care Management Note (Addendum)
    Page 1 of 2   09/21/2011     3:19:44 PM   CARE MANAGEMENT NOTE 09/21/2011  Patient:  Scott Ingram, Scott Ingram   Account Number:  [id number]  Date Initiated:  09/14/2011  Documentation initiated by:  Junius Creamer  Subjective/Objective Assessment:   adm w mi     Action/Plan:   lives w wife. no ins. pt does not have ss number and md feels may not be elidg for  brilintal pt assist program. did give pt 30day free card for brilinta. md to switch to plavix after 30days since generic.   Anticipated DC Date:  09/20/2011   Anticipated DC Plan:  HOME/SELF CARE      DC Planning Services  CM consult  Medication Assistance      Choice offered to / List presented to:     DME arranged  OTHER - SEE COMMENT      DME agency  OTHER - SEE NOTE        Status of service:  Completed, signed off Medicare Important Message given?   (If response is "NO", the following Medicare IM given date fields will be blank) Date Medicare IM given:   Date Additional Medicare IM given:    Discharge Disposition:  HOME/SELF CARE  Per UR Regulation:  Reviewed for med. necessity/level of care/duration of stay  If discussed at Long Length of Stay Meetings, dates discussed:    Comments:  09/20/2011 1745 Contacted main pharmacy and pt eligible for [employer] med assistance fund. Spoke to pt's son that speaks [place] and he translated for pt. Explained fund can be used once per year. States he does not work or have drug coverage. Scott Donning Ingram CCM Case Mgmt phone [phone]  09-18-11 3pm Scott Ingram, [place] 657  [phone] - Late entry Spoke with Morrie Sheldon - [employer] rep on life vest.  Coordinated fitting for Saturday pm with interpretor, and fitting reps. Plan for fitting at 4pm.  Staff updated with plan.  7/5  14:22p Scott Ingram,Scott Ingram paperwork done for lifevest.pt going to son's home in [place] at [address],[place] [phone] son edin r calderon cruz ph [phone]. [employer] rep working on contract w sw dept for approval so  pt can be fitted.  2:30pm Scott Ingram - [phone] Planning for interpretor to be here at 4pm on Saturday for life vest fitting.  coordinating with Morrie Sheldon from[employer] and interpretation services through SW at the hospital.   7/3 Scott Ingram,Scott Ingram still on iv dopamine. cont to follow.  7/1 9:14a Scott Ingram,Scott Ingram [phone]

## 2011-09-14 NOTE — Progress Notes (Addendum)
[facility] DAILY PROGRESS NOTE  NAME:  Scott Ingram   MRN: [id number] DOB:  Dec 08, 1955   ADMIT DATE: 09/13/2011   Patient Description   56 y.o. male with PMH below presented with weakness, N/V and Chest pressure radiating to his L arm.  ECG in ER demonstrated InferoLateral STEMI (II, III, aVF, V4) with "evolving" biphasicTWI, Anteroseptal Q waves as well as TWI in V5-6. He was hypotensive with SBPs in the 70s --> increased to 90s after ~2L IVF.   Past Medical History  Diagnosis Date  . Diabetes mellitus   . HTN (hypertension)    Clinical Course:  Taken Emergently to the Cath Lab --> complex, difficult PCI of the mid & distal LAD.  Dopamine infusion initiated for what was clearly cardiogenic shock. Length of Stay:  LOS: 1 day    Subjective:   Today Scott Ingram continues to feel nauseated and very fatigued, but he denies any further CP or SOB  Objective:  Temp:  [98.5 F (36.9 C)-100.1 F (37.8 C)] 100.1 F (37.8 C) (07/01 2000) Pulse Rate:  [74-101] 82  (07/01 2100) Resp:  [4-29] 17  (07/01 2100) BP: (76-140)/(55-79) 109/70 mmHg (07/01 2100) SpO2:  [90 %-100 %] 93 % (07/01 2100) Arterial Line BP: (88-130)/(53-71) 100/62 mmHg (07/01 1000) Weight change:  Physical Exam: General appearance: alert, cooperative, appears stated age, fatigued, no distress and pleasant mood & affect.  Non-toxic, but ill appearing Neck: no adenopathy, no carotid bruit, supple, symmetrical, trachea midline, thyroid not enlarged, symmetric, no tenderness/mass/nodules and no apparent JVD Lungs: clear to auscultation bilaterally, normal percussion bilaterally and non-labored with intermittent interstitial sounds Heart: regular rate and rhythm, S1, S2 normal, S4 present, no rub and no S3 heard Abdomen: soft, NT/ND, somewhat increased breath sounds Extremities: extremities normal, atraumatic, no cyanosis or edema Pulses: diminished RLE pulses Neurologic:  Grossly normal  Intake/Output from previous day: 06/30 0701 - 07/01 0700 In: 867.6 [P.O.:120; I.V.:747.6] Out: 2250 [Urine:2100; Emesis/NG output:150]  Intake/Output Summary (Last 24 hours) at 09/14/11 2236 Last data filed at 09/14/11 2100  Gross per 24 hour  Intake 1608.23 ml  Output   2175 ml  Net -566.77 ml    Results for orders placed during the hospital encounter of 09/13/11 (from the past 24 hour(s))  CBC     Status: Abnormal   Collection Time   09/14/11  4:20 AM      Component Value Range   WBC 12.7 (*) 4.0 - 10.5 K/uL   RBC 3.87 (*) 4.22 - 5.81 MIL/uL   Hemoglobin 12.0 (*) 13.0 - 17.0 g/dL   HCT 40.9 (*) 81.1 - 91.4 %   MCV 87.9  78.0 - 100.0 fL   MCH 31.0  26.0 - 34.0 pg   MCHC 35.3  30.0 - 36.0 g/dL   RDW 78.2  95.6 - 21.3 %   Platelets 213  150 - 400 K/uL  BASIC METABOLIC PANEL     Status: Abnormal   Collection Time   09/14/11  4:20 AM      Component Value Range   Sodium 137  135 - 145 mEq/L   Potassium 3.9  3.5 - 5.1 mEq/L   Chloride 101  96 - 112 mEq/L   CO2 21  19 - 32 mEq/L   Glucose, Bld 339 (*) 70 - 99 mg/dL   BUN 22  6 - 23 mg/dL   Creatinine, Ser 0.86  0.50 - 1.35 mg/dL   Calcium 8.1 (*) 8.4 -  10.5 mg/dL   GFR calc non Af Amer >90  >90 mL/min   GFR calc Af Amer >90  >90 mL/min  HEMOGLOBIN A1C     Status: Abnormal   Collection Time   09/14/11  4:20 AM      Component Value Range   Hemoglobin A1C 13.1 (*) <5.7 %   Mean Plasma Glucose 329 (*) <117 mg/dL  LIPID PANEL     Status: Normal   Collection Time   09/14/11  4:20 AM      Component Value Range   Cholesterol 157  0 - 200 mg/dL   Triglycerides 82  <119 mg/dL   HDL 49  >14 mg/dL   Total CHOL/HDL Ratio 3.2     VLDL 16  0 - 40 mg/dL   LDL Cholesterol 92  0 - 99 mg/dL  GLUCOSE, CAPILLARY     Status: Abnormal   Collection Time   09/14/11  8:03 AM      Component Value Range   Glucose-Capillary 307 (*) 70 - 99 mg/dL  POCT ACTIVATED CLOTTING TIME     Status: Normal   Collection Time   09/14/11 10:19 AM        Component Value Range   Activated Clotting Time 139    GLUCOSE, CAPILLARY     Status: Abnormal   Collection Time   09/14/11 12:39 PM      Component Value Range   Glucose-Capillary 200 (*) 70 - 99 mg/dL  CARDIAC PANEL(CRET KIN+CKTOT+MB+TROPI)     Status: Abnormal   Collection Time   09/14/11 12:55 PM      Component Value Range   Total CK 1382 (*) 7 - 232 U/L   CK, MB 20.1 (*) 0.3 - 4.0 ng/mL   Troponin I >20.00 (*) <0.30 ng/mL   Relative Index 1.5  0.0 - 2.5  GLUCOSE, CAPILLARY     Status: Abnormal   Collection Time   09/14/11  3:59 PM      Component Value Range   Glucose-Capillary 240 (*) 70 - 99 mg/dL  CARDIAC PANEL(CRET KIN+CKTOT+MB+TROPI)     Status: Abnormal   Collection Time   09/14/11  8:30 PM      Component Value Range   Total CK 890 (*) 7 - 232 U/L   CK, MB 9.6 (*) 0.3 - 4.0 ng/mL   Troponin I >20.00 (*) <0.30 ng/mL   Relative Index 1.1  0.0 - 2.5  GLUCOSE, CAPILLARY     Status: Abnormal   Collection Time   09/14/11  9:46 PM      Component Value Range   Glucose-Capillary 286 (*) 70 - 99 mg/dL    Imaging:  Cath: Culprit - 100% thrombotic, calcified occlusion of mid RCA with ~70% distal RCA along with what is likely a chronically occluded LAD at the ostium.  EF ~25%. Inferior Akinesis with Anterolateral hypokinesis.  MAR Reviewed  Assessment/Plan:  Principal Problem:  *Cardiogenic shock Active Problems:  STEMI, RCA BMS 09/13/11  Ischemic cardiomyopathy, EF 25%  at cath  HTN (hypertension)  Diabetes mellitus  Family history of coronary artery disease, pt's brother had MI at 34  Overall, he seems to be better, but is still very tired & lethargic, which may be partly due to his not having eaten since Friday.  He continues to be "borderline hypotensive", but maintaining an acceptable MAP on low dose Dopamine infusion. He continues to have difficulty with Nausea -- I am not sure what the actual cause for this is.  The course is a bit too long for a viral  Gastoenteritis.  He is hyperglycemic -- his MI presentation could have been associated with HONK, but he was not acidotic.  Another potential is low output Heart Failure.     On DAPT -- will need to ensure that he is able to be on either Plavix, Effient or Brilinta for at least 1 month, but preferably one year.  He is not elgible for the 1 month free Effient or Brilinta.  Case Mngt consulted.  Continue with low dose Dopamine for now, attempt to wean off tomorrow if he is starting to feel better.  His BP remained stable with lowdose ACE-I, but not willing to titrate up as yet.  No BB until safely of Dopamine.  His renal function has improved, and he had a pretty significant diuresis post-cath after one dose of IV Lasix.  With his low EF and elevated LVEDP, he will need to be on at least some PO Lasix to start tomorrow.  Lantus and SSI started last PM -- continues to be hyperglycemic, despite minimal PO intake.  Restarted oral hypoglycemic  On statin  Continue to symptomatic Rx of nausea, will add PPI for GI prophylaxis.  Echo ordered.  Mr. Migliaccio is quite sick.  He has severe ischemic cardiomyopathy (EF ~25%) with elevated filling pressures by catheterization.  He would benefit from LifeVest on discharge, but I am not sure how this can be arranged for him as he has no medical insurance. We will need to look into options for him.  He is going to need close follow-up & does not have an Tourist information centre manager in [place].  He will probably stay with his son here in [place] until well enough to travel.  If he is still here for the next few weeks post d/c, he should be seen @ [facility] for at least one visit.  Time Spent Directly with Patient:  25 minutes   Ramere Downs W, M.D., M.S. [facility] [address] [place], [place]  [address]  [phone]  09/14/2011 10:36 PM

## 2011-09-14 NOTE — Progress Notes (Signed)
*  PRELIMINARY RESULTS* Echocardiogram 2D Echocardiogram has been performed.  Scott Ingram 09/14/2011, 4:00 PM

## 2011-09-14 NOTE — Progress Notes (Signed)
Small hematoma noted before pulled sheath. 6 fr sheath pulled from L femoral artery. Started holding pressure at 1106 and held for 20 minutes. Hematoma same size at the end of the sheath pulled. Gauze and Tegaderm applied. Pt stable. Pt asleep family given post-sheath removal instructions. Family verbalized understanding.

## 2011-09-14 NOTE — Progress Notes (Signed)
Clinical social worker attempted to assess patient however, pt currently sleeping. Pt asked csw to come back to complete assessment. Per chart review, pt does not have insurance or a s.s number. CSW will assess for any possible csw needs.   Catha Gosselin, Theresia Majors  [phone] .09/14/2011 14:31pm

## 2011-09-14 NOTE — Progress Notes (Signed)
Inpatient Diabetes Program Recommendations  AACE/ADA: New Consensus Statement on Inpatient Glycemic Control (2009)  Target Ranges:  Prepandial:   less than 140 mg/dL      Peak postprandial:   less than 180 mg/dL (1-2 hours)      Critically ill patients:  140 - 180 mg/dL   Z6X=09.6 MD-will patient be discharged on insulin?  If so, consider switch to Novolin 70/30 as this will be most affordable for patient with no insurance.  Insulin administration education will also need to be started ASAP. Will follow.  Thank you  Piedad Climes [facility] Inpatient Diabetes Coordinator [phone]

## 2011-09-14 NOTE — ED Provider Notes (Signed)
Medical screening examination/treatment/procedure(s) were performed by non-physician practitioner and as supervising physician I was immediately available for consultation/collaboration.  Shivank Pinedo K Linker, MD 09/14/11 1504 

## 2011-09-15 DIAGNOSIS — I739 Peripheral vascular disease, unspecified: Secondary | ICD-10-CM | POA: Diagnosis present

## 2011-09-15 LAB — BASIC METABOLIC PANEL
BUN: 20 mg/dL (ref 6–23)
CO2: 24 mEq/L (ref 19–32)
Calcium: 8.1 mg/dL — ABNORMAL LOW (ref 8.4–10.5)
Chloride: 101 mEq/L (ref 96–112)
Creatinine, Ser: 0.65 mg/dL (ref 0.50–1.35)
GFR calc Af Amer: >90 mL/min (ref >90)
GFR calc non Af Amer: >90 mL/min (ref >90)
Glucose, Bld: 233 mg/dL — ABNORMAL HIGH (ref 70–99)
Potassium: 3.9 mEq/L (ref 3.5–5.1)
Sodium: 137 mEq/L (ref 135–145)

## 2011-09-15 LAB — CBC
HCT: 35 % — ABNORMAL LOW (ref 39.0–52.0)
Hemoglobin: 12.1 g/dL — ABNORMAL LOW (ref 13.0–17.0)
MCH: 30.3 pg (ref 26.0–34.0)
MCHC: 34.6 g/dL (ref 30.0–36.0)
MCV: 87.7 fL (ref 78.0–100.0)
Platelets: 230 10*3/uL (ref 150–400)
RBC: 3.99 MIL/uL — ABNORMAL LOW (ref 4.22–5.81)
RDW: 12.2 % (ref 11.5–15.5)
WBC: 14.6 10*3/uL — ABNORMAL HIGH (ref 4.0–10.5)

## 2011-09-15 LAB — CARDIAC PANEL(CRET KIN+CKTOT+MB+TROPI)
CK, MB: 6.3 ng/mL (ref 0.3–4.0)
Relative Index: 1 (ref 0.0–2.5)
Total CK: 657 U/L — ABNORMAL HIGH (ref 7–232)
Troponin I: >20 ng/mL (ref <0.30)

## 2011-09-15 LAB — URINE CULTURE
Colony Count: NO GROWTH
Culture: NO GROWTH

## 2011-09-15 LAB — GLUCOSE, CAPILLARY
Glucose-Capillary: 250 mg/dL — ABNORMAL HIGH (ref 70–99)
Glucose-Capillary: 208 mg/dL — ABNORMAL HIGH (ref 70–99)

## 2011-09-15 MED ORDER — PANTOPRAZOLE SODIUM 40 MG PO TBEC
40.0000 mg | DELAYED_RELEASE_TABLET | Freq: Every day | ORAL | Status: DC
  Administered 2011-09-15 – 2011-09-20 (×6): 40 mg via ORAL
  Filled 2011-09-15 (×6): qty 1

## 2011-09-15 MED ORDER — METOCLOPRAMIDE HCL 5 MG/5ML PO SOLN
10.0000 mg | Freq: Three times a day (TID) | ORAL | Status: DC
  Administered 2011-09-15 – 2011-09-20 (×20): 10 mg via ORAL
  Filled 2011-09-15 (×24): qty 10

## 2011-09-15 MED ORDER — TRAMADOL HCL 50 MG PO TABS
50.0000 mg | ORAL_TABLET | Freq: Four times a day (QID) | ORAL | Status: DC | PRN
  Filled 2011-09-15: qty 1

## 2011-09-15 MED ORDER — FUROSEMIDE 20 MG PO TABS
20.0000 mg | ORAL_TABLET | ORAL | Status: DC
  Filled 2011-09-15: qty 1

## 2011-09-15 NOTE — Progress Notes (Signed)
Pt with hypotension/cardiogenic shock. Will hold ambulation today unless o/w specified. Will f/u tom. Ethelda Chick CES, ACSM

## 2011-09-15 NOTE — Progress Notes (Signed)
Patient Description   56 y.o. male with PMH of DM & HTN presented with weakness, N/V and Chest pressure radiating to his L arm. ECG in ER demonstrated InferoLateral STEMI (II, III, aVF, V4) with "evolving" biphasicTWI, Anteroseptal Q waves as well as TWI in V5-6.  He was hypotensive with SBPs in the 70s --> increased to 90s after ~2L IVF.   He was taken Emergently to the Cath Lab 6/30 --> complex, difficult PCI of the mid & distal LAD. Dopamine infusion initiated for what was clearly cardiogenic shock.   Subjective:  Nausea improved, still weak. Able to tolerate some food. Walked today, but BP did drop to 80s.  Objective:  Vital Signs in the last 24 hours: Temp:  [98.6 F (37 C)-100.1 F (37.8 C)] 99 F (37.2 C) (07/02 0800) Pulse Rate:  [71-92] 71  (07/02 0800) Resp:  [9-21] 13  (07/02 0800) BP: (87-140)/(59-79) 100/72 mmHg (07/02 0800) SpO2:  [90 %-98 %] 98 % (07/02 0800) Arterial Line BP: (100-111)/(62-65) 100/62 mmHg (07/01 1000)  Intake/Output from previous day:  Intake/Output Summary (Last 24 hours) at 09/15/11 7829 Last data filed at 09/15/11 0600  Gross per 24 hour  Intake 1246.9 ml  Output   1125 ml  Net  121.9 ml    Physical Exam: General appearance: alert, cooperative and no distress Lungs: faint crackle bilat at bases, non-labored. Heart: regular rate and rhythm, no M/R, + soft S4; JVP not elevated. Abd: soft, only mildly tender.  Increased BS (he just ate) Pulses: diminnished distal pulses Lt groin without hematoma or echymosis   Rate: 90  Rhythm: normal sinus rhythm  Lab Results:  Basename 09/15/11 0408 09/14/11 0420  WBC 14.6* 12.7*  HGB 12.1* 12.0*  PLT 230 213    Basename 09/15/11 0408 09/14/11 0420  NA 137 137  K 3.9 3.9  CL 101 101  CO2 24 21  GLUCOSE 233* 339*  BUN 20 22  CREATININE 0.65 0.74    Basename 09/15/11 0406 09/14/11 2030  TROPONINI >20.00* >20.00*   Hepatic Function Panel  Basename 09/13/11 1633  PROT 6.9  ALBUMIN 3.8   AST 232*  ALT 42  ALKPHOS 85  BILITOT 0.4  BILIDIR --  IBILI --    Basename 09/14/11 0420  CHOL 157   No results found for this basename: INR in the last 72 hours  Imaging: Imaging results have been reviewed  Cardiac Studies:  Assessment/Plan:   Principal Problem:  *Cardiogenic shock Active Problems:  STEMI, RCA BMS 09/13/11  Ischemic cardiomyopathy, EF 25%  at cath -- Chronically occluded LAD  Diabetes mellitus  HTN (hypertension), now hypotensive post MI  Family history of coronary artery disease, pt's brother had MI at 86   Plan- still hypotensive, will hold ACE for B/P less than 110, still on Dopamine, start beta blocker when Dopamine is off. Dr Herbie Baltimore wanted Lasix 20mg  daily secondary to high filling pressures at cath. Will add PPI. Keep in CCU today.  Corine Shelter PA-C 09/15/2011, 8:22 AM  ATTENDING ATTESTATION:  I have seen and examined the patient along with Dorene Grebe, PA.  I have reviewed the chart, notes and new data.  I agree with Luke's note.  Key new complaints: less nauseated, weak; overall feels & looks much stronger.  Able to walk today.  Key examination changes: less rales in lungs.  BP remains soft.  Key new findings / data: labs reviewed, no notable changes.  Troponin not checked today (check in AM).  PLAN:  I am stopping his ACE-I until he is able to maintain a BP > 100 mmHg on < of Dopamine.  Not enough BP with persistent "hypotension" to start BB.  Will make his Lasix dose a PM dose (not BID) to avoid AM hypotension.  Hold if BP < .  Continue with Dopamine - consider changing to dobutamine if BP is able to stay > with low dose Dopamine (I am concerned that Dobutamine may lead to hypotension.  May make the change tomorrow.  Continue to gently increase PO intake.  I suspect some of his nausea is due to low Cardiac Output, but may also be a component of gastroparesis -- will start reglan  Continue glycemic control  regimen  Keep in CCU until BP remains stable.    Marykay Lex, M.D., M.S. [facility] [address] [place], [place]  [address]  [phone]  09/15/2011 3:34 PM

## 2011-09-15 NOTE — Clinical Social Work Psychosocial (Signed)
     Clinical Social Work Department BRIEF PSYCHOSOCIAL ASSESSMENT 09/15/2011  Patient:  Scott Ingram, Scott Ingram     Account Number:  [id number]     Admit date:  09/13/2011  Clinical Social Worker:  Hulan Fray  Date/Time:  09/15/2011 09:51 AM  Referred by:  RN  Date Referred:  09/13/2011 Referred for  Other - See comment   Other Referral:   "other"   Interview type:  Patient Other interview type:    PSYCHOSOCIAL DATA Living Status:  ALONE Admitted from facility:   Level of care:   Primary support name:  Dutch Quint Primary support relationship to patient:  CHILD, ADULT Degree of support available:   supportive    CURRENT CONCERNS Current Concerns  None Noted   Other Concerns:    SOCIAL WORK ASSESSMENT / PLAN Clinical Social Worker received referral for patient. Previous CSW, Baxter Hire, reviewed chart and saw that patient does not have ss number or insurance. CM is assisting with medication. Patient stated that he is from [place] and his son will provide transportation at discharge. Patient did not state any concerns or need for CSW intervention. CSW will sign off. If new needs arise, please re-consult CSW.   Assessment/plan status:  No Further Intervention Required Other assessment/ plan:   Information/referral to community resources:   none needed at this time.    PATIENTS/FAMILYS RESPONSE TO PLAN OF CARE: Patient was appreciative of CSW's visit and did not state any concerns.

## 2011-09-15 NOTE — Progress Notes (Signed)
CARDIAC REHAB PHASE I   PRE:  Rate/Rhythm: 89 SR  BP:  Supine: 90/65  Sitting: 77/61 recheck 85/66  Standing: 88/48   SaO2: 99 RA  MODE:  Ambulation: 200 ft   POST:  Rate/Rhythem: 108 ST  BP:  Supine:   Sitting: 89/56  Standing:    SaO2: 100 RA 1420-1500  On arrival pt in bed without c/o. Sat pt up on side of bed BP 77/61 pt denies any dizziness. Got pt to drink some fluid and continued to sit on side of bed ,BP rechecked 85/66. Pt stood BP 88/48. He continues to deny any dizziness. Assisted X 2 and used walker to ambulate. Gait steady, walked 200 feet without c/o of cp or SOB. Pt to recliner after walk, BP sitting 89/56. Reported to pt's RN. Beatrix Fetters

## 2011-09-16 ENCOUNTER — Encounter (HOSPITAL_COMMUNITY): Payer: Self-pay | Admitting: Cardiology

## 2011-09-16 LAB — CBC
HCT: 36.3 % — ABNORMAL LOW (ref 39.0–52.0)
Hemoglobin: 12.9 g/dL — ABNORMAL LOW (ref 13.0–17.0)
MCH: 30.6 pg (ref 26.0–34.0)
MCHC: 35.5 g/dL (ref 30.0–36.0)
MCV: 86.2 fL (ref 78.0–100.0)
Platelets: 256 10*3/uL (ref 150–400)
RBC: 4.21 MIL/uL — ABNORMAL LOW (ref 4.22–5.81)
RDW: 11.9 % (ref 11.5–15.5)
WBC: 10 10*3/uL (ref 4.0–10.5)

## 2011-09-16 LAB — BASIC METABOLIC PANEL
BUN: 15 mg/dL (ref 6–23)
CO2: 23 mEq/L (ref 19–32)
Calcium: 8.1 mg/dL — ABNORMAL LOW (ref 8.4–10.5)
Chloride: 100 mEq/L (ref 96–112)
Creatinine, Ser: 0.62 mg/dL (ref 0.50–1.35)
GFR calc Af Amer: >90 mL/min (ref >90)
GFR calc non Af Amer: >90 mL/min (ref >90)
Glucose, Bld: 224 mg/dL — ABNORMAL HIGH (ref 70–99)
Potassium: 3.7 mEq/L (ref 3.5–5.1)
Sodium: 136 mEq/L (ref 135–145)

## 2011-09-16 LAB — GLUCOSE, CAPILLARY: Glucose-Capillary: 209 mg/dL — ABNORMAL HIGH (ref 70–99)

## 2011-09-16 LAB — CARDIAC PANEL(CRET KIN+CKTOT+MB+TROPI)
CK, MB: 2.7 ng/mL (ref 0.3–4.0)
Troponin I: 18.37 ng/mL (ref <0.30)

## 2011-09-16 MED ORDER — SODIUM CHLORIDE 0.9 % IV SOLN
INTRAVENOUS | Status: AC
  Administered 2011-09-16: 11:00:00 via INTRAVENOUS

## 2011-09-16 MED ORDER — FUROSEMIDE 20 MG PO TABS
20.0000 mg | ORAL_TABLET | ORAL | Status: DC
  Filled 2011-09-16: qty 1

## 2011-09-16 MED ORDER — ENOXAPARIN SODIUM 40 MG/0.4ML ~~LOC~~ SOLN
40.0000 mg | SUBCUTANEOUS | Status: DC
  Administered 2011-09-16 – 2011-09-20 (×5): 40 mg via SUBCUTANEOUS
  Filled 2011-09-16 (×5): qty 0.4

## 2011-09-16 MED ORDER — INSULIN GLARGINE 100 UNIT/ML ~~LOC~~ SOLN
7.0000 [IU] | Freq: Every day | SUBCUTANEOUS | Status: DC
  Administered 2011-09-16 – 2011-09-17 (×2): 7 [IU] via SUBCUTANEOUS

## 2011-09-16 MED ORDER — LIVING WELL WITH DIABETES BOOK
Freq: Once | Status: AC
  Administered 2011-09-16: 12:00:00
  Filled 2011-09-16 (×2): qty 1

## 2011-09-16 MED ORDER — SODIUM CHLORIDE 0.9 % IV BOLUS (SEPSIS)
500.0000 mL | Freq: Once | INTRAVENOUS | Status: AC
  Administered 2011-09-16: 500 mL via INTRAVENOUS

## 2011-09-16 NOTE — Progress Notes (Signed)
ANTICOAGULATION CONSULT NOTE - Initial Consult  Pharmacy Consult for Lovenox Indication: VTE prophylaxis  No Known Allergies  Patient Measurements: Height: 5\' 5"  (165.1 cm) Weight: 143 lb 15.4 oz (65.3 kg) IBW/kg (Calculated) : 61.5  Heparin Dosing Weight: 65.3 kg  Vital Signs: Temp: 98.7 F (37.1 C) (07/03 0800) Temp src: Oral (07/03 0800) BP: 102/68 mmHg (07/03 0800) Pulse Rate: 86  (07/03 0800)  Labs:  Basename 09/16/11 0500 09/15/11 0408 09/15/11 0406 09/14/11 2030 09/14/11 0420  HGB 12.9* 12.1* -- -- --  HCT 36.3* 35.0* -- -- 34.0*  PLT 256 230 -- -- 213  APTT -- -- -- -- --  LABPROT -- -- -- -- --  INR -- -- -- -- --  HEPARINUNFRC -- -- -- -- --  CREATININE 0.62 0.65 -- -- 0.74  CKTOTAL 222 -- 657* 890* --  CKMB 2.7 -- 6.3* 9.6* --  TROPONINI 18.37* -- >20.00* >20.00* --    Estimated Creatinine Clearance: 89.7 ml/min (by C-G formula based on Cr of 0.62).   Medical History: Past Medical History  Diagnosis Date  . Diabetes mellitus   . HTN (hypertension)   . Ischemic cardiomyopathy 09/13/11    Chronic LAD occlusion, now s/p InferoLateral STEMI - RCA occlusion;  EF ~25%  . ST elevation myocardial infarction (STEMI) of inferolateral wall 09/13/11    Presented with Cardiogenic shock; 2 site PCI of RCA; distal RCA system provides collaterals to the LAD  . 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 aspirin  81 mg Oral Daily  . atorvastatin  80 mg Oral q1800  . enoxaparin (LOVENOX) injection  40 mg Subcutaneous Q24H  . glipiZIDE  10 mg Oral BID AC  . insulin aspart  0-9 Units Subcutaneous TID WC  . insulin glargine  7 Units Subcutaneous QHS  . metoCLOPramide  10 mg Oral TID WC & HS  . pantoprazole  40 mg Oral Q0600  . sodium chloride  500 mL Intravenous Once  . sodium chloride  3 mL Intravenous Q12H  . Ticagrelor  90 mg Oral BID  . DISCONTD: furosemide  40 mg  Intravenous Once  . DISCONTD: furosemide  20 mg Oral BID  . DISCONTD: furosemide  20 mg Oral Q24H  . DISCONTD: furosemide  20 mg Oral Q24H  . DISCONTD: insulin glargine  5 Units Subcutaneous QHS  . DISCONTD: lisinopril  2.5 mg Oral Daily    Assessment: 56 yo male s/p cath lab to receive Lovenox for VTE prophylaxis.  CrCl normal, weight = 65.3 kg.    Goal of Therapy:  VTE prophylaxis Monitor platelets by anticoagulation protocol: Yes   Plan:  1. Lovenox 40 mg sq daily. 2. Will monitor CBC/platelet count.  Scott Ingram, Scott Ingram 09/16/2011,9:34 AM

## 2011-09-16 NOTE — Progress Notes (Signed)
CARDIAC REHAB PHASE I   PRE:  Rate/Rhythm: 86 SR    BP: lying 83/57, sitting 85/46, standing 96/61    SaO2: 99 RA  MODE:  Ambulation: 350 ft   POST:  Rate/Rhythm: 110 ST    BP: sitting 81/50, 92/40 2-3 min later     SaO2: 99 RA  Tolerated very well. Feels good. Sts no dizziness. Sts he wants to run. Will f/u. [phone] Harriet Masson CES, ACSM

## 2011-09-16 NOTE — Progress Notes (Signed)
Patient Description  56 y.o. male with PMH of DM & HTN presented with weakness, N/V and Chest pressure radiating to his L arm. ECG in ER demonstrated InferoLateral STEMI (II, III, aVF, V4) with "evolving" biphasicTWI, Anteroseptal Q waves aswell as TWI in V5-6.  He was hypotensive with SBPs in the 70s --> increased to 90s after ~2L IVF.  He was taken Emergently to the Cath Lab 6/30 --> complex, difficult PCI of the mid & distal LAD. Dopamine infusion initiated for what was clearly cardiogenic shock.    Subjective:  No chest pain or SOB.  Objective:  Vital Signs in the last 24 hours: Temp:  [98.7 F (37.1 C)-99.7 F (37.6 C)] 99.2 F (37.3 C) (07/03 0400) Pulse Rate:  [71-90] 83  (07/03 0600) Resp:  [7-26] 19  (07/03 0600) BP: (73-144)/(50-84) 100/64 mmHg (07/03 0600) SpO2:  [96 %-99 %] 98 % (07/03 0600) Weight:  [65.3 kg (143 lb 15.4 oz)] 65.3 kg (143 lb 15.4 oz) (07/03 0500)  Intake/Output from previous day:  Intake/Output Summary (Last 24 hours) at 09/16/11 0749 Last data filed at 09/16/11 0600  Gross per 24 hour  Intake 1436.7 ml  Output   1700 ml  Net -263.3 ml    Physical Exam: General appearance: alert, cooperative and no distress Lungs: clear to auscultation bilaterally Heart: regular rate and rhythm   Rate: 92  Rhythm: normal sinus rhythm  Lab Results:  Basename 09/16/11 0500 09/15/11 0408  WBC 10.0 14.6*  HGB 12.9* 12.1*  PLT 256 230    Basename 09/16/11 0500 09/15/11 0408  NA 136 137  K 3.7 3.9  CL 100 101  CO2 23 24  GLUCOSE 224* 233*  BUN 15 20  CREATININE 0.62 0.65    Basename 09/16/11 0500 09/15/11 0406  TROPONINI 18.37* >20.00*   Hepatic Function Panel  Basename 09/13/11 1633  PROT 6.9  ALBUMIN 3.8  AST 232*  ALT 42  ALKPHOS 85  BILITOT 0.4  BILIDIR --  IBILI --    Basename 09/14/11 0420  CHOL 157   Imaging: Imaging results have been reviewed  Cardiac Studies:  Assessment/Plan:  Principal Problem:  *STEMI, RCA BMS  09/13/11 Active Problems:  Ischemic cardiomyopathy, EF 25%  at cath  Diabetes mellitus  PVD (peripheral vascular disease), RCIA stenosis at cath  HTN (hypertension), now hypotensive post MI  Family history of coronary artery disease, pt's brother had MI at 47  Plan- Pt remains hypotensive requiring Dopamine to keep B/P > 90 systolic. His BNP is 3000, no CHF on exam, ? Elevated post MI.  Consider stopping Lasix 20mg  and hydrating.  Corine Shelter PA-C 09/16/2011, 7:49 AM  ATTENDING ATTESTATION:  I have seen and examined the patient along with Corine Shelter, PA.  I have reviewed the chart, notes and new data.  I agree with Luke's note. PLAN:  His PO intake has not been progressing as well as I had hoped - I agree that he may still be a bit intravascularly volume depleted.  I agree with holding lasix for now & giving a small IVF bolus to buffer his BP to see if we can wean off Dopamine. Will run low level of IVF after bolus for today.  We are still unable to use BB or ACE-I until his pressures remain stable.  Continue glycemic control.    Added Reglan yesterday  PPI for GI prophylaxis; will use [place] Lovenox for DVT prophylaxis as he is relatively sedentary.  Will need to consider PICC line  if we continue Dopamine for much longer.  Marykay Lex, M.D., M.S. [facility] [address] [place], [place]  [address]  [phone]  09/16/2011 8:19 AM

## 2011-09-16 NOTE — Progress Notes (Addendum)
Inpatient Diabetes Program Recommendations  AACE/ADA: New Consensus Statement on Inpatient Glycemic Control (2009)  Target Ranges:  Prepandial:   less than 140 mg/dL      Peak postprandial:   less than 180 mg/dL (1-2 hours)      Critically ill patients:  140 - 180 mg/dL    Inpatient Diabetes Program Recommendations Insulin - Meal Coverage: Add Novolog 4 units with meals for post prandial elevations  Add: patient speaks mostly [place] and some [place].  Patient said he has had DM for 18 years.  Informed him of his A1C=13.1  RN will assist patient with viewing the Spanish videos for DM on the patient education network.   Handouts given for [employer] ReliOn meter and insulin if MD decides to discharge patient on insulin.  Will likely need insulin. Thank you  Piedad Climes [facility] Inpatient Diabetes Coordinator [phone]

## 2011-09-17 LAB — GLUCOSE, CAPILLARY
Glucose-Capillary: 237 mg/dL — ABNORMAL HIGH (ref 70–99)
Glucose-Capillary: 269 mg/dL — ABNORMAL HIGH (ref 70–99)
Glucose-Capillary: 225 mg/dL — ABNORMAL HIGH (ref 70–99)
Glucose-Capillary: 240 mg/dL — ABNORMAL HIGH (ref 70–99)

## 2011-09-17 MED ORDER — MAGNESIUM HYDROXIDE 400 MG/5ML PO SUSP
15.0000 mL | Freq: Every day | ORAL | Status: DC | PRN
  Filled 2011-09-17: qty 30

## 2011-09-17 MED ORDER — MAGNESIUM HYDROXIDE 400 MG/5ML PO SUSP
30.0000 mL | Freq: Every day | ORAL | Status: DC | PRN
  Administered 2011-09-17: 30 mL via ORAL
  Filled 2011-09-17: qty 30

## 2011-09-17 NOTE — Progress Notes (Signed)
[facility]  DAILY PROGRESS NOTE   Subjective:  No events recorded overnight. He was able to walk with cardiac rehab yesterday. Troponin is finally trending down below 20.  Objective:  Temp:  [97.9 F (36.6 C)-99.4 F (37.4 C)] 97.9 F (36.6 C) (07/04 0400) Pulse Rate:  [80-104] 84  (07/04 0700) Resp:  [8-32] 21  (07/04 0700) BP: (79-127)/(40-83) 89/65 mmHg (07/04 0700) SpO2:  [96 %-100 %] 99 % (07/04 0700) Weight:  [66.4 kg (146 lb 6.2 oz)] 66.4 kg (146 lb 6.2 oz) (07/04 0500) Weight change: 1.1 kg (2 lb 6.8 oz)  Intake/Output from previous day: 07/03 0701 - 07/04 0700 In: 2264.2 [P.O.:1180; I.V.:574.2; IV Piggyback:510] Out: 2000 [Urine:2000]  Intake/Output from this shift:    Medications: Current Facility-Administered Medications  Medication Dose Route Frequency Provider Last Rate Last Dose  . 0.9 %  sodium chloride infusion  250 mL Intravenous Continuous Marykay Lex, MD 20 mL/hr at 09/16/11 0516 1,000 mL at 09/16/11 0516  . 0.9 %  sodium chloride infusion   Intravenous Continuous Marykay Lex, MD 50 mL/hr at 09/16/11 1101    . acetaminophen (TYLENOL) tablet 650 mg  650 mg Oral Q4H PRN Marykay Lex, MD      . ALPRAZolam Prudy Feeler) tablet 0.25 mg  0.25 mg Oral BID PRN Marykay Lex, MD      . aspirin chewable tablet 81 mg  81 mg Oral Daily Marykay Lex, MD   81 mg at 09/16/11 1100  . atorvastatin (LIPITOR) tablet 80 mg  80 mg Oral q1800 Marykay Lex, MD   80 mg at 09/16/11 1719  . DOPamine (INTROPIN) 800 mg in dextrose 5 % 250 mL infusion  5 mcg/kg/min Intravenous Titrated Marykay Lex, MD   1 mcg/kg/min at 09/16/11 1400  . enoxaparin (LOVENOX) injection 40 mg  40 mg Subcutaneous Q24H Jessica C Carney, PHARMD   40 mg at 09/16/11 1227  . fentaNYL (SUBLIMAZE) injection 25 mcg  25 mcg Intravenous Q1H PRN Marykay Lex, MD   25 mcg at 09/14/11 1106  . glipiZIDE (GLUCOTROL) tablet 10 mg  10 mg Oral BID AC Marykay Lex, MD   10  mg at 09/16/11 1719  . insulin aspart (novoLOG) injection 0-9 Units  0-9 Units Subcutaneous TID WC Marykay Lex, MD   3 Units at 09/16/11 1720  . insulin glargine (LANTUS) injection 7 Units  7 Units Subcutaneous QHS Eda Paschal [place], [place]   7 Units at 09/16/11 2152  . living well with diabetes book MISC   Does not apply Once Marykay Lex, MD      . metoCLOPramide [facility]) 5 MG/5ML solution 10 mg  10 mg Oral TID WC & HS Marykay Lex, MD   10 mg at 09/16/11 2153  . morphine 2 MG/ML injection 1 mg  1 mg Intravenous Q1H PRN Marykay Lex, MD   1 mg at 09/14/11 0413  . ondansetron (ZOFRAN) injection 4 mg  4 mg Intravenous Q6H PRN Marykay Lex, MD   4 mg at 09/13/11 2355  . pantoprazole (PROTONIX) EC tablet 40 mg  40 mg Oral Q0600 Eda Paschal [place], [place]   40 mg at 09/17/11 0551  . sodium chloride 0.9 % bolus 500 mL  500 mL Intravenous Once Marykay Lex, MD   500 mL at 09/16/11 0845  . sodium chloride 0.9 % injection 3 mL  3 mL Intravenous Q12H Marykay Lex,  MD   3 mL at 09/16/11 2152  . sodium chloride 0.9 % injection 3 mL  3 mL Intravenous PRN Marykay Lex, MD      . Ticagrelor [facility]) tablet 90 mg  90 mg Oral BID Marykay Lex, MD   90 mg at 09/16/11 2153  . traMADol (ULTRAM) tablet 50 mg  50 mg Oral Q6H PRN Abelino Derrick, PA      . zolpidem (AMBIEN) tablet 10 mg  10 mg Oral QHS PRN Abelino Derrick, PA      . DISCONTD: furosemide (LASIX) tablet 20 mg  20 mg Oral Q24H Marykay Lex, MD      . DISCONTD: furosemide (LASIX) tablet 20 mg  20 mg Oral Q24H Eda Paschal [place], [place]      . DISCONTD: insulin glargine (LANTUS) injection 5 Units  5 Units Subcutaneous QHS Wilburt Finlay, PA   5 Units at 09/15/11 2154    Physical Exam: General appearance: alert and no distress Neck: no adenopathy, no carotid bruit, no JVD, supple, symmetrical, trachea midline and thyroid not enlarged, symmetric, no tenderness/mass/nodules Lungs: clear to auscultation bilaterally Heart: regular rate and rhythm, S1, S2  normal and systolic murmur: early systolic 2/6, crescendo at lower left sternal border Abdomen: soft, non-tender; bowel sounds normal; no masses,  no organomegaly Extremities: trace pedal edema Pulses: 1+ symmetric Skin: Skin color, texture, turgor normal. No rashes or lesions Neurologic: Grossly normal  Lab Results: Results for orders placed during the hospital encounter of 09/13/11 (from the past 48 hour(s))  GLUCOSE, CAPILLARY     Status: Abnormal   Collection Time   09/15/11  8:07 AM      Component Value Range Comment   Glucose-Capillary 189 (*) 70 - 99 mg/dL   GLUCOSE, CAPILLARY     Status: Abnormal   Collection Time   09/15/11 11:52 AM      Component Value Range Comment   Glucose-Capillary 255 (*) 70 - 99 mg/dL   GLUCOSE, CAPILLARY     Status: Abnormal   Collection Time   09/15/11  5:15 PM      Component Value Range Comment   Glucose-Capillary 250 (*) 70 - 99 mg/dL   GLUCOSE, CAPILLARY     Status: Abnormal   Collection Time   09/15/11  9:57 PM      Component Value Range Comment   Glucose-Capillary 208 (*) 70 - 99 mg/dL   CBC     Status: Abnormal   Collection Time   09/16/11  5:00 AM      Component Value Range Comment   WBC 10.0  4.0 - 10.5 K/uL    RBC 4.21 (*) 4.22 - 5.81 MIL/uL    Hemoglobin 12.9 (*) 13.0 - 17.0 g/dL    HCT 16.1 (*) 09.6 - 52.0 %    MCV 86.2  78.0 - 100.0 fL    MCH 30.6  26.0 - 34.0 pg    MCHC 35.5  30.0 - 36.0 g/dL    RDW 04.5  40.9 - 81.1 %    Platelets 256  150 - 400 K/uL   BASIC METABOLIC PANEL     Status: Abnormal   Collection Time   09/16/11  5:00 AM      Component Value Range Comment   Sodium 136  135 - 145 mEq/L    Potassium 3.7  3.5 - 5.1 mEq/L    Chloride 100  96 - 112 mEq/L    CO2 23  19 -  32 mEq/L    Glucose, Bld 224 (*) 70 - 99 mg/dL    BUN 15  6 - 23 mg/dL    Creatinine, Ser 1.61  0.50 - 1.35 mg/dL    Calcium 8.1 (*) 8.4 - 10.5 mg/dL    GFR calc non Af Amer >90  >90 mL/min    GFR calc Af Amer >90  >90 mL/min   CARDIAC PANEL(CRET  KIN+CKTOT+MB+TROPI)     Status: Abnormal   Collection Time   09/16/11  5:00 AM      Component Value Range Comment   Total CK 222  7 - 232 U/L    CK, MB 2.7  0.3 - 4.0 ng/mL    Troponin I 18.37 (*) <0.30 ng/mL    Relative Index 1.2  0.0 - 2.5   PRO B NATRIURETIC PEPTIDE     Status: Abnormal   Collection Time   09/16/11  5:00 AM      Component Value Range Comment   Pro B Natriuretic peptide (BNP) 3058.0 (*) 0 - 125 pg/mL   GLUCOSE, CAPILLARY     Status: Abnormal   Collection Time   09/16/11  8:17 AM      Component Value Range Comment   Glucose-Capillary 186 (*) 70 - 99 mg/dL   GLUCOSE, CAPILLARY     Status: Abnormal   Collection Time   09/16/11 12:18 PM      Component Value Range Comment   Glucose-Capillary 223 (*) 70 - 99 mg/dL   GLUCOSE, CAPILLARY     Status: Abnormal   Collection Time   09/16/11  4:59 PM      Component Value Range Comment   Glucose-Capillary 237 (*) 70 - 99 mg/dL   GLUCOSE, CAPILLARY     Status: Abnormal   Collection Time   09/16/11  9:57 PM      Component Value Range Comment   Glucose-Capillary 209 (*) 70 - 99 mg/dL     Imaging: No results found.  Assessment:  1. Principal Problem: 2.  *STEMI, RCA BMS 09/13/11 3. Active Problems: 4.  Ischemic cardiomyopathy, EF 25%  at cath 5.  HTN (hypertension), now hypotensive post MI 6.  Diabetes mellitus 7.  Family history of coronary artery disease, pt's brother had MI at 5 8.  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 Cardiogenic shock  Plan:  1. He is improving slowly. Blood pressure is stable today around 90 systolic off of dopamine. He is not as nauseated this morning. Will need to watch his appetite and ambulate today. Ok for transfer to 2900 stepdown if available. Still holding ACE/B-blocker due to hypotension.  He is a candidate for LifeVest which we will arrange for a period of at least 90 days. Hold lasix today with plan to resume tomorrow - appears to have recovering RV infarct/cardiogenic  shock.  Time Spent Directly with Patient:  15 minutes  Length of Stay:  LOS: 4 days   Chrystie Nose, MD, [facility] Attending Cardiologist [facility]  Zaiah Credeur C 09/17/2011, 7:40 AM

## 2011-09-17 NOTE — Progress Notes (Signed)
I have sent in order for life vest.

## 2011-09-17 NOTE — Progress Notes (Signed)
Patient ambulated in hallway.  4 laps completed. No SOB. Tolerated very well.

## 2011-09-18 LAB — GLUCOSE, CAPILLARY
Glucose-Capillary: 217 mg/dL — ABNORMAL HIGH (ref 70–99)
Glucose-Capillary: 238 mg/dL — ABNORMAL HIGH (ref 70–99)
Glucose-Capillary: 158 mg/dL — ABNORMAL HIGH (ref 70–99)

## 2011-09-18 MED ORDER — DOCUSATE SODIUM 100 MG PO CAPS
200.0000 mg | ORAL_CAPSULE | Freq: Two times a day (BID) | ORAL | Status: DC
  Administered 2011-09-18 – 2011-09-20 (×5): 200 mg via ORAL
  Filled 2011-09-18 (×7): qty 2

## 2011-09-18 MED ORDER — METFORMIN HCL 500 MG PO TABS
500.0000 mg | ORAL_TABLET | Freq: Two times a day (BID) | ORAL | Status: DC
Start: 2011-09-18 — End: 2011-09-20
  Administered 2011-09-18 – 2011-09-20 (×5): 500 mg via ORAL
  Filled 2011-09-18 (×7): qty 1

## 2011-09-18 MED ORDER — FUROSEMIDE 20 MG PO TABS
20.0000 mg | ORAL_TABLET | Freq: Every day | ORAL | Status: DC
  Administered 2011-09-18 – 2011-09-20 (×3): 20 mg via ORAL
  Filled 2011-09-18 (×3): qty 1

## 2011-09-18 MED ORDER — BD GETTING STARTED TAKE HOME KIT: 1/2ML X 30G SYRINGES
1.0000 | Freq: Once | Status: AC
  Administered 2011-09-18: 1
  Filled 2011-09-18: qty 1

## 2011-09-18 MED ORDER — LISINOPRIL 2.5 MG PO TABS
2.5000 mg | ORAL_TABLET | Freq: Every day | ORAL | Status: DC
  Administered 2011-09-18 – 2011-09-19 (×2): 2.5 mg via ORAL
  Filled 2011-09-18 (×4): qty 1

## 2011-09-18 MED ORDER — INSULIN GLARGINE 100 UNIT/ML ~~LOC~~ SOLN
10.0000 [IU] | Freq: Every day | SUBCUTANEOUS | Status: DC
  Administered 2011-09-18: 10 [IU] via SUBCUTANEOUS

## 2011-09-18 MED ORDER — PSYLLIUM 95 % PO PACK
1.0000 | PACK | Freq: Every day | ORAL | Status: DC
  Administered 2011-09-18 – 2011-09-20 (×3): 1 via ORAL
  Filled 2011-09-18 (×3): qty 1

## 2011-09-18 NOTE — Progress Notes (Signed)
Results for CALDERON-BANEGAS, [place] (MRN [id number]) as of 09/18/2011 09:18  Ref. Range 09/17/2011 08:18 09/17/2011 11:41 09/17/2011 16:08 09/17/2011 21:39  Glucose-Capillary Latest Range: 70-99 mg/dL 409 (H) 811 (H) 914 (H) 240 (H)    Results for CALDERON-BANEGAS, [place] (MRN [id number]) as of 09/18/2011 09:18  Ref. Range 09/14/2011 04:20  Hemoglobin A1C Latest Range: <5.7 % 13.1 (H)   Poorly controlled CBGs at home as evidenced by current A1c.  Only on oral diabetes medications at home prior to admission.  Likely needs insulin to better control glucose levels in the outpatient setting.  Noted patient does not have insurance.  DM Coordinator spoke to patient on Wednesday about the importance of better CBG control.  DM Coordinator also gave patient information on obtaining an inexpensive CBG meter OTC at [facility].  MD, if you decide to send to send patient home on insulin, recommend 70/30 insulin.  Patient can get Reli-on brand 70/30 insulin for $25 per vial at [facility].  If you choose to send him home on this insulin, make sure to write in comments section of prescription that you want him to get "Reli-on brand 70/30 insulin from [facility]".  Will order vial and syringe insulin starter kit for RN to teach patient insulin administration asap.    Based on weight and current needs, recommend conversion of patient to 70/30 insulin today.  Can start tonight at supper.  Recommend 70/30 insulin 9 units bid with meals to start.  Please make sure to d/c Lantus if decision made to convert patient to 70/30 insulin.  Will follow. Ambrose Finland RN, MSN, CDE Diabetes Coordinator Inpatient Diabetes Program [phone]

## 2011-09-18 NOTE — Progress Notes (Signed)
Walking independently without c/o. Acquired translator and completed ed. Difficult due to many tangents. Not sure how close pt will follow instructions in diet, etc. Son seemed frustrated with him. Wife present and sts she will help with food. Has Spanish books on MI, HF, diabetes. Wrote ex gl in [place]. Requests his name be sent to [place] [facility]. Gave financial aid application. [phone] Ethelda Chick CES, ACSM

## 2011-09-18 NOTE — Progress Notes (Signed)
Subjective: No complaints, walking in hall this am  Objective: Vital signs in last 24 hours: Temp:  [98.1 F (36.7 C)-99.1 F (37.3 C)] 98.1 F (36.7 C) (07/05 0800) Pulse Rate:  [79-95] 95  (07/04 2000) Resp:  [8-29] 20  (07/05 0400) BP: (79-103)/(44-77) 96/71 mmHg (07/05 0800) SpO2:  [94 %-99 %] 99 % (07/05 0800) Weight change:  Last BM Date: 09/13/11 Intake/Output from previous day: +215 07/04 0701 - 07/05 0700 In: 1890 [P.O.:1890] Out: 1675 [Urine:1675] Intake/Output this shift: Total I/O In: 240 [P.O.:240] Out: -   PE: General:alert and oriented, no complaints Heart:S1S2 S4 gallup, no murmur Lungs:few crackles rt. base Abd:+ BS, soft, non tender +BS  Ext:no edema Neuro:alert and oriented X3, MAE   Lab Results:  Basename 09/16/11 0500  WBC 10.0  HGB 12.9*  HCT 36.3*  PLT 256   BMET  Basename 09/16/11 0500  NA 136  K 3.7  CL 100  CO2 23  GLUCOSE 224*  BUN 15  CREATININE 0.62  CALCIUM 8.1*    Basename 09/16/11 0500  TROPONINI 18.37*    Lab Results  Component Value Date   CHOL 157 09/14/2011   HDL 49 09/14/2011   LDLCALC 92 09/14/2011   TRIG 82 09/14/2011   CHOLHDL 3.2 09/14/2011   Lab Results  Component Value Date   HGBA1C 13.1* 09/14/2011     No results found for this basename: TSH    Hepatic Function Panel No results found for this basename: PROT,ALBUMIN,AST,ALT,ALKPHOS,BILITOT,BILIDIR,IBILI in the last 72 hours No results found for this basename: CHOL in the last 72 hours No results found for this basename: PROTIME in the last 72 hours    EKG: Orders placed during the hospital encounter of 09/13/11  . ED EKG  . ED EKG  . EKG 12-LEAD  . EKG 12-LEAD  . EKG 12-LEAD  . EKG 12-LEAD  . EKG 12-LEAD  . EKG 12-LEAD    Studies/Results: No results found.  Medications: I have reviewed the patient's current medications.    Marland Kitchen aspirin  81 mg Oral Daily  . atorvastatin  80 mg Oral q1800  . enoxaparin (LOVENOX) injection  40 mg Subcutaneous Q24H   . glipiZIDE  10 mg Oral BID AC  . insulin aspart  0-9 Units Subcutaneous TID WC  . insulin glargine  7 Units Subcutaneous QHS  . metoCLOPramide  10 mg Oral TID WC & HS  . pantoprazole  40 mg Oral Q0600  . sodium chloride  3 mL Intravenous Q12H  . Ticagrelor  90 mg Oral BID   Assessment/Plan: Patient Active Problem List  Diagnosis  . STEMI, RCA BMS 09/13/11  . Ischemic cardiomyopathy, EF 25%  at cath  . HTN (hypertension), now hypotensive post MI  . Diabetes mellitus, uncontrolled  . Family history of coronary artery disease, pt's brother had MI at 9  . 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  PLAN:? Transfer to tele.  Add low dose lasix this am and begin ACE low dose this pm.  Needs improved diabetic control.   LOS: 5 days   INGOLD,LAURA R 09/18/2011, 8:57 AM  I have seen & examined the patient along with Ms. Ingold.  I agree with her findings, examination, and plan. He is looking much stronger today.  Eating better & ambulating without difficulty. He seems happier with his wife here.  Only concern is lack of BM.  I agree with Dr. Blanchie Dessert plan to re-institute low dose diuretic today  as his volume status has stabilized.  I he is able to tolerate PO Lasix this AM, will try to re-institute low dose ACE-I for afterload reduction.  His HR is up in the 90s-100ss, but is likely compensatory.  If he tolerates the ACE-I overnight, I would like to start low dose Toprol prior to discharge.  He is ready for transfer to Telemetry. Increase Lantus based upon CBG levels.  Continue Reglan. PPI & Lovenox for prophylaxis.  Increase Bowel regimen.  Continue to work with [facility].  Marykay Lex, M.D., M.S. [facility] [address] [place], [place]  [address]  [phone] Pager # [phone]  09/18/2011 10:26 AM

## 2011-09-19 ENCOUNTER — Encounter (HOSPITAL_COMMUNITY): Payer: Self-pay | Admitting: Cardiology

## 2011-09-19 DIAGNOSIS — Z9189 Other specified personal risk factors, not elsewhere classified: Secondary | ICD-10-CM | POA: Diagnosis present

## 2011-09-19 LAB — BASIC METABOLIC PANEL
CO2: 24 mEq/L (ref 19–32)
Calcium: 8.5 mg/dL (ref 8.4–10.5)
Creatinine, Ser: 0.81 mg/dL (ref 0.50–1.35)

## 2011-09-19 LAB — CBC
MCV: 87.1 fL (ref 78.0–100.0)
Platelets: 244 10*3/uL (ref 150–400)
RDW: 12 % (ref 11.5–15.5)
WBC: 6.5 10*3/uL (ref 4.0–10.5)

## 2011-09-19 LAB — GLUCOSE, CAPILLARY: Glucose-Capillary: 103 mg/dL — ABNORMAL HIGH (ref 70–99)

## 2011-09-19 MED ORDER — INSULIN ASPART PROT & ASPART (70-30 MIX) 100 UNIT/ML ~~LOC~~ SUSP
9.0000 [IU] | Freq: Two times a day (BID) | SUBCUTANEOUS | Status: DC
  Administered 2011-09-19 – 2011-09-20 (×3): 9 [IU] via SUBCUTANEOUS
  Filled 2011-09-19: qty 10

## 2011-09-19 NOTE — Progress Notes (Signed)
CARDIAC REHAB PHASE I   PRE:  Rate/Rhythm: 84 SR  BP:  Supine:   Sitting: 106/62  Standing:    SaO2: 99 RA  MODE:  Ambulation: 550 ft   POST:  Rate/Rhythem: 91  BP:  Supine:   Sitting: 104/64  Standing:    SaO2: 96 RA  Scott Ingram  Pt ambulated 550 ft independently with no complaints or symptoms reported. Pt tolerated very well. Returned to bedside. VSS. Will follow up. Electronically signed by Harriett Sine MS on Saturday September 19 2011 at 1034

## 2011-09-19 NOTE — Progress Notes (Signed)
Subjective: Feels much better.  Ambulating in hall. Tolerated ACE-I last PM  Objective: Vital signs in last 24 hours: Temp:  [98.8 F (37.1 C)-99.4 F (37.4 C)] 98.9 F (37.2 C) (07/06 0551) Pulse Rate:  [77-89] 77  (07/06 0551) Resp:  [18-19] 19  (07/06 0551) BP: (90-102)/(43-72) 90/43 mmHg (07/06 0551) SpO2:  [95 %-97 %] 95 % (07/06 0551) Weight change:  Last BM Date: 09/13/11 Intake/Output from previous day: +480 07/05 0701 - 07/06 0700 In: 480 [P.O.:480] Out: -  Intake/Output this shift:    PE: General appearance: alert, cooperative, appears stated age, no distress and very pleasant mood & affect Neck: no adenopathy, no carotid bruit, no JVD, supple, symmetrical, trachea midline and thyroid not enlarged, symmetric, no tenderness/mass/nodules Lungs: clear to auscultation bilaterally, normal percussion bilaterally and non-labored Heart: regular rate and rhythm, S1, S2 normal, S4 present and non M/R Abdomen: soft, non-tender; bowel sounds normal; no masses,  no organomegaly Extremities: extremities normal, atraumatic, no cyanosis or edema Pulses: 2+ and symmetric Neurologic: Grossly normal   Lab Results:  Basename 09/19/11 0538  WBC 6.5  HGB 10.1*  HCT 29.7*  PLT 244   BMET  Basename 09/19/11 0538  NA 137  K 3.9  CL 103  CO2 24  GLUCOSE 149*  BUN 12  CREATININE 0.81  CALCIUM 8.5   No results found for this basename: TROPONINI:2,CK,MB:2 in the last 72 hours  Lab Results  Component Value Date   CHOL 157 09/14/2011   HDL 49 09/14/2011   LDLCALC 92 09/14/2011   TRIG 82 09/14/2011   CHOLHDL 3.2 09/14/2011   Lab Results  Component Value Date   HGBA1C 13.1* 09/14/2011     Studies/Results: No results found.  Medications: I have reviewed the patient's current medications.    Marland Kitchen aspirin  81 mg Oral Daily  . atorvastatin  80 mg Oral q1800  . bd getting started take home kit  1 kit Other Once  . docusate sodium  200 mg Oral BID  . enoxaparin (LOVENOX) injection  40  mg Subcutaneous Q24H  . furosemide  20 mg Oral Daily  . glipiZIDE  10 mg Oral BID AC  . insulin aspart  0-9 Units Subcutaneous TID WC  . insulin glargine  10 Units Subcutaneous QHS  . lisinopril  2.5 mg Oral QHS  . metFORMIN  500 mg Oral BID WC  . metoCLOPramide  10 mg Oral TID WC & HS  . pantoprazole  40 mg Oral Q0600  . psyllium  1 packet Oral Daily  . sodium chloride  3 mL Intravenous Q12H  . Ticagrelor  90 mg Oral BID  . DISCONTD: insulin glargine  7 Units Subcutaneous QHS   Assessment/Plan: Patient Active Problem List  Diagnosis  . STEMI, RCA BMS 09/13/11  . Ischemic cardiomyopathy, EF 25%  at cath  . HTN (hypertension), now hypotensive post MI  . Diabetes mellitus, uncontrolled  . Family history of coronary artery disease, pt's brother had MI at 63  . 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  Will change to 70/30 for tonight.  BP @ 90 systolic this AM with addition of lisinopril last pm. Life vest is in process.   LOS: 6 days   INGOLD,LAURA R 09/19/2011, 8:08 AM  I have seen & examined the patient this AM (exam above by me) alnog with Nada Boozer, NP.  I agree with Laura's findings & recommendations.  He seems to  be getting stronger every day.  No overt SSx of CHF & despite his BPs in 90s-100s, he is not in shock.    I do not think that we can titrate up his ACE-I or lasix any more for now.  I would like to start a low dose BB early after discharge.  Will keep on low dose Lasix - need to be ~net even  His glycemic control is much improved, but I do agree with changing to 70/30 insulin tonight for cost purposes. -- will continue reglan for now.    He will need to establish a PCP to continue to follow his DM, with current HgbA1c of ~13.  On PO hypoglycemic and Metformin  He is close to being medically ready for discharge tomorrow vs. Monday.  He should be fitted for LifeVest this PM.  He is probably  going to be living in [place] over the next few months, so we will arrange for him to be seen @ [facility] for initial f/u.   We need to ensure that he will be able to get his Brilinta -- if not would be best to change to Plavix -- It would be best for him to be on DAPT for at least 1 year.  Continue statin - but would reduce to 40mg  for d/c  Marykay Lex, M.D., M.S. [facility] [address] [place], [place]  [address]  [phone] Pager # [phone]  09/19/2011 10:07 AM

## 2011-09-19 NOTE — Progress Notes (Signed)
ANTICOAGULATION CONSULT NOTE - Follow Up Consult  Pharmacy Consult for Lovenox Indication: VTE prophylaxis  No Known Allergies  Patient Measurements: Height: 5\' 5"  (165.1 cm) Weight: 146 lb 6.2 oz (66.4 kg) IBW/kg (Calculated) : 61.5   Labs:  Basename 09/19/11 0538  HGB 10.1*  HCT 29.7*  PLT 244  APTT --  LABPROT --  INR --  HEPARINUNFRC --  CREATININE 0.81  CKTOTAL --  CKMB --  TROPONINI --    Estimated Creatinine Clearance: 88.6 ml/min (by C-G formula based on Cr of 0.81).   Medications:  Scheduled:    . aspirin  81 mg Oral Daily  . atorvastatin  80 mg Oral q1800  . bd getting started take home kit  1 kit Other Once  . docusate sodium  200 mg Oral BID  . enoxaparin (LOVENOX) injection  40 mg Subcutaneous Q24H  . furosemide  20 mg Oral Daily  . glipiZIDE  10 mg Oral BID AC  . insulin aspart  0-9 Units Subcutaneous TID WC  . insulin aspart protamine-insulin aspart  9 Units Subcutaneous BID WC  . lisinopril  2.5 mg Oral QHS  . metFORMIN  500 mg Oral BID WC  . metoCLOPramide  10 mg Oral TID WC & HS  . pantoprazole  40 mg Oral Q0600  . psyllium  1 packet Oral Daily  . sodium chloride  3 mL Intravenous Q12H  . Ticagrelor  90 mg Oral BID  . DISCONTD: insulin glargine  10 Units Subcutaneous QHS    Assessment:  56 y/o male currently receiving Lovenox for VTE prophylaxis.  Renal fuction stable.  No active bleeding noted.  Platelets stable.  Patient ambulating.  Plan:   Lovenox to be continued at 40 mg sq daily.  There are no new issues requiring further dosage adjustments.  Pharmacy will sign off.  Please order any further orders that is appropriate for the treatment plan of this patient.  Thank you for allowing Pharmacy be part of this patient's treatment team.   Scott Ingram.D  09/19/2011,10:46 AM

## 2011-09-20 ENCOUNTER — Encounter (HOSPITAL_COMMUNITY): Payer: Self-pay | Admitting: Cardiology

## 2011-09-20 DIAGNOSIS — R570 Cardiogenic shock: Secondary | ICD-10-CM

## 2011-09-20 DIAGNOSIS — I2510 Atherosclerotic heart disease of native coronary artery without angina pectoris: Secondary | ICD-10-CM

## 2011-09-20 DIAGNOSIS — R945 Abnormal results of liver function studies: Secondary | ICD-10-CM

## 2011-09-20 DIAGNOSIS — R7989 Other specified abnormal findings of blood chemistry: Secondary | ICD-10-CM

## 2011-09-20 HISTORY — DX: Atherosclerotic heart disease of native coronary artery without angina pectoris: I25.10

## 2011-09-20 HISTORY — DX: Cardiogenic shock: R57.0

## 2011-09-20 HISTORY — DX: Other specified abnormal findings of blood chemistry: R79.89

## 2011-09-20 HISTORY — DX: Abnormal results of liver function studies: R94.5

## 2011-09-20 LAB — BASIC METABOLIC PANEL
CO2: 23 mEq/L (ref 19–32)
Calcium: 8.6 mg/dL (ref 8.4–10.5)
Chloride: 103 mEq/L (ref 96–112)
Glucose, Bld: 102 mg/dL — ABNORMAL HIGH (ref 70–99)
Potassium: 3.9 mEq/L (ref 3.5–5.1)
Sodium: 137 mEq/L (ref 135–145)

## 2011-09-20 LAB — GLUCOSE, CAPILLARY: Glucose-Capillary: 148 mg/dL — ABNORMAL HIGH (ref 70–99)

## 2011-09-20 MED ORDER — ASPIRIN 81 MG PO CHEW: 81.0000 mg | CHEWABLE_TABLET | Freq: Every day | ORAL | Status: AC

## 2011-09-20 MED ORDER — DSS 100 MG PO CAPS: 100.0000 mg | ORAL_CAPSULE | Freq: Two times a day (BID) | ORAL | Status: AC | PRN

## 2011-09-20 MED ORDER — SIMVASTATIN 20 MG PO TABS: 20.0000 mg | ORAL_TABLET | Freq: Every evening | ORAL | Status: DC

## 2011-09-20 MED ORDER — METOPROLOL TARTRATE 25 MG PO TABS: 12.5000 mg | ORAL_TABLET | Freq: Two times a day (BID) | ORAL | Status: DC

## 2011-09-20 MED ORDER — METOPROLOL SUCCINATE 12.5 MG HALF TABLET
12.5000 mg | ORAL_TABLET | Freq: Every day | ORAL | Status: DC
  Administered 2011-09-20: 12.5 mg via ORAL
  Filled 2011-09-20: qty 1

## 2011-09-20 MED ORDER — PANTOPRAZOLE SODIUM 40 MG PO TBEC: 40.0000 mg | DELAYED_RELEASE_TABLET | Freq: Every day | ORAL | Status: DC

## 2011-09-20 MED ORDER — FUROSEMIDE 20 MG PO TABS: 20.0000 mg | ORAL_TABLET | Freq: Every day | ORAL | Status: DC

## 2011-09-20 MED ORDER — METOPROLOL SUCCINATE ER 25 MG PO TB24: 12.5000 mg | ORAL_TABLET | Freq: Every day | ORAL | Status: DC

## 2011-09-20 MED ORDER — INSULIN ASPART PROT & ASPART (70-30 MIX) 100 UNIT/ML ~~LOC~~ SUSP: 9.0000 [IU] | Freq: Two times a day (BID) | SUBCUTANEOUS | Status: DC

## 2011-09-20 MED ORDER — ATORVASTATIN CALCIUM 80 MG PO TABS: 80.0000 mg | ORAL_TABLET | Freq: Every day | ORAL | Status: DC

## 2011-09-20 MED ORDER — LISINOPRIL 2.5 MG PO TABS: 2.5000 mg | ORAL_TABLET | Freq: Every day | ORAL | Status: DC

## 2011-09-20 MED ORDER — NITROGLYCERIN 0.4 MG SL SUBL: 0.4000 mg | SUBLINGUAL_TABLET | SUBLINGUAL | Status: AC | PRN

## 2011-09-20 MED ORDER — TICAGRELOR 90 MG PO TABS: 90.0000 mg | ORAL_TABLET | Freq: Two times a day (BID) | ORAL | Status: DC

## 2011-09-20 MED ORDER — FAMOTIDINE 20 MG PO TABS: 20.0000 mg | ORAL_TABLET | Freq: Two times a day (BID) | ORAL | Status: DC

## 2011-09-20 MED ORDER — METOPROLOL SUCCINATE 12.5 MG HALF TABLET: 12.5000 mg | ORAL_TABLET | Freq: Every day | ORAL | Status: DC

## 2011-09-20 MED ORDER — PSYLLIUM 95 % PO PACK: 1.0000 | PACK | Freq: Every day | ORAL | Status: DC | PRN

## 2011-09-20 MED ORDER — METOCLOPRAMIDE HCL 10 MG PO TABS: 10.0000 mg | ORAL_TABLET | Freq: Three times a day (TID) | ORAL | Status: AC

## 2011-09-20 NOTE — Discharge Summary (Signed)
Physician Discharge Summary  Patient ID: Scott Ingram MRN: [id number] DOB/AGE: 56/13/1957 56 y.o.  Admit date: 09/13/2011 Discharge date: 09/20/2011  Discharge Diagnoses:  Principal Problem:  *STEMI, RCA BMS 09/13/11 Active Problems:  Ischemic cardiomyopathy, EF 25%  at cath and with Echo  At risk for sudden cardiac death, for Life vest until improvement of EF or ICD placement  Cardiogenic shock, secondary to acute MI, improved with Dopamine and fluids, resolved at discharge  Diabetes mellitus, uncontrolled, on admit, improved at discharge with addition of insulin  Abnormal LFTs  CAD (coronary artery disease), with residual 100% LAD stenosis and disease of the AV groove.  HTN (hypertension), now hypotensive post MI  Family history of coronary artery disease, pt's brother had MI at 83  PVD (peripheral vascular disease), RCIA stenosis at cath   Discharged Condition: good  Procedures: Emergent Cardiac cath 09/13/11 by Dr. Herbie Baltimore. Rescue PTCA and stent and aspiration thrombectomy to culprit mid RCA lesion.   Hospital Course: Scott Ingram,  56 y.o. male with a PMH of DM who started having GI upset with N/V starting after eating out Friday PM. He continued to have difficulty keeping anything down on day of admit.   09/13/11  he started noting chest tightness and shortness of breath along with his GI upset. He presented to [facility] ER 09/13/11 through Triage, and his initial ECG demonstrated ~1-1.23mm STE in leads II, II, aVF with biphasic TWI as well as TWI with flat ST segments in V5-V6. There are reciprocal changes noted in I and aVL. Upon arrival, his BP was in the low 80s - increased to the 90s after 1.5L NS. Pain was improved with Dr. Elissa Hefty exam down to ~3/10 on my exam. Code STEMI was called for EKG that demonstrated subtle, 0.5-1 mm ST elevations with biphasic T waves in leads 2,3 and aVF as well as V4 with ST depressions in 1 and aVL. Also noted were Anteroseptal Q waves  with anterolateral T wave inversions in V5 and V6 with subtle ST elevations in lead V3. His initial Vital Signs show a blood pressure of 74/52 mmHg that improved to 105/66 mmHg after nearly 2 L of normal saline infusion.  He was taken emergently to the cath lab.  Impression:  Severe Native Coronary Disease with the culprit lesion being the 100%, calcified, thrombotic occlusion of the mid RCA in the setting of near ostial occlusion of the LAD and diffuse small vessel disease of the Circumflex system as well as the distal RCA system as noted post-PCI.  Status-post successful very complex/difficult PCI of the mid and distal RCA with Aspiration Thrombectomy, high pressure Noncompliant Balloon Predilation, the Guide-Liner catheter as well as extra strength Support Wire usage.  Severe Ischemic Cardiomyopathy with an Ejection Fraction of roughly 25% with severely elevated EP of 32 mmHg.  Severe, near subtotal occlusion of the ostium of the Right Common Iliac Artery.  Cardiogenic shock on arrival, nearly resolved post PCI. Please note a bare metal stent was placed.  Patient was admitted to the intensive care unit where he remained on dopamine drip for hypotension due to cardiogenic shock. Continued with nausea and Reglan was added to his medical regimen.  His glycohemoglobin was significantly elevated at 13, his Uncontrolled diabetes was reviewed the patient physician as well as diabetic coordinator.  By 09/15/2011 patient was still hypotensive and symptomatic with dizziness with any ambulation. ACE inhibitor was held due to hypertension and small fluid boluses were continued IV dopamine was continued.  Lantus was  added to patient's medical regimen.  That I discharge due to the cost of Lantus this was changed to insulin 7030. Additionally greater than 48 hours after his cardiac cath he was put back on his metformin.   Due to the significantly elevated glycohemoglobin he would need insulin, he was transitioned to  70/30 and he received self injection instructions.  During hospitalization Troponin was greater than 20 for numerous checks.  By 09/17/2011 he was off the dopamine does blood pressure remained around 90 systolic.  With 2-D echo revealing EF of 20-25% it was felt he would be a lifevest for high risk cardiac arrest.  By July 5 but dose beta blocker was added and then on the next day Low dose Lasix And very small dose of ACE 1.  Patient was transferred to telemetry he was ambulated with cardiac rehabilitation and continued to progress.  By 09/20/2011 patient was seen and evaluated by Dr. Herbie Baltimore his blood pressure was stable on medications and he was ready for discharge home.  Actually the patient will be going to his son's home in [place] with followup in our office. He'll stay with his son for Family that time until he is recovering from his acute MI.  He will follow up at [facility] until he has an established cardiologist in [place].  Patient does have peripheral arterial disease that will need to be evaluated as an outpatient.  Please note patient also had elevated AST on admission and that will also be reevaluated as an outpatient as he is on statins.  It may have been elevated due to his cardiogenic shock with acute ST elevation MI.  Other plan will be to repeat echocardiogram in 90 days to evaluate for ICD. He did have life vest at discharge.  Low grade fever at discharge, prob post MI. Normal WBC.  Please note at time of discharge pt stated he did not have money to buy his medications.  We changed some meds toprolol XL to lopressor, Lipitor to Zocor and at low dose due to elevated AST.  Also protonix to pepcid.  The hospital filled 3 day supply of his meds except Brilinta for that medication he had a 30 day free card.       Consults: None  Significant Diagnostic Studies:  LABS:  Albumin 3.8 lipase 22 AST 232 ALT 42 glucose on admission was 420 total bili 0.4  Hemoglobin A 1C 13.1  UA with  greater than 1000 of glucose moderate RBC, Urine culture was negative  Data cholesterol 157 triglycerides 82 HDL 49 LDL 92  CK 1382 MB 04.5 troponin I peak >20.0  ProBNP 3058,  Prior to discharge  2736  Chemistry at discharge sodium 137 potassium 3.9 chloride 103 CO2 23 BUN 9 creatinine 0.77 magnesium 1.9 glucose 102  Hemoglobin 10.1 hematocrit 29.7 WBC 6.5 and platelets 244  Portable chest x-ray:  Mild cardiomegaly. No acute abnormality  EKG as stated on admission, At discharge Evolutionary EKG changes of inferior lateral MI  2-D echo 09/14/2011:  Left ventricle: The cavity size was normal. There was mild concentric hypertrophy. Systolic function was severely reduced. The estimated ejection fraction was in the range of 20% to 25%. There is akinesis and thinning of the anteroseptum and apex consistent with prior LAD territory infarct. There is severe hypokinesis of the inferior and posterior walls. The lateral wall appears to movenormally.An apical false tendon (normal variant) is present. Doppler parameters are consistent with abnormal left ventricular relaxation (grade 1 diastolic dysfunction). The  E/e' ratio is >10, suggesting elevated LV filling pressure. - Aortic valve: Structurally normal valve. Trileaflet. No regurgitation. - Mitral valve: Calcified annulus. Mild regurgitation. - Left atrium: The atrium was at the upper limits of normal in size. - Tricuspid valve: Mild late leaflet prolapse. Mild regurgitation. - Pulmonic valve: The valve appears to be grossly normal. Trivial regurgitation. - Pulmonary arteries: PA peak pressure: 41mm Hg (S). - Inferior vena cava: The vessel was dilated; the respirophasic diameter changes were blunted (< 50%); findings are consistent with elevated central venous pressure.    Discharge Exam: Blood pressure 97/70, pulse 79, temperature 99.1 F (37.3 C), temperature source Oral, resp. rate 16, height 5\' 5"  (1.651 m), weight 66.4 kg (146 lb 6.2 oz),  SpO2 99.00%.     General appearance: alert, cooperative, appears stated age, no distress and very pleasant mood & affect  Neck: no adenopathy, no carotid bruit, no JVD, supple, symmetrical, trachea midline and thyroid not enlarged, symmetric, no tenderness/mass/nodules  Lungs: clear to auscultation bilaterally, normal percussion bilaterally and non-labored  Heart: regular rate and rhythm, S1, S2 normal, S4 present and no M/R  Abdomen: soft, non-tender; bowel sounds normal; no masses, no organomegaly  Extremities:no clubbing, cyanosis or edema  Pulses: 2+ and symmetric  Neurologic: Grossly normal  Disposition: home  Discharge Orders    Future Orders Please Complete By Expires   Amb Referral to Cardiac Rehabilitation        Medication List  As of 09/20/2011  9:01 PM   STOP taking these medications         ibuprofen 200 MG tablet         TAKE these medications         aspirin 81 MG chewable tablet   Chew 1 tablet (81 mg total) by mouth daily.      DSS 100 MG Caps   Take 100 mg by mouth 2 (two) times daily as needed for constipation.      famotidine 20 MG tablet   Commonly known as: PEPCID   Take 1 tablet (20 mg total) by mouth 2 (two) times daily.      furosemide 20 MG tablet   Commonly known as: LASIX   Take 1 tablet (20 mg total) by mouth daily.      glipiZIDE 10 MG tablet   Commonly known as: GLUCOTROL   Take 10 mg by mouth 2 (two) times daily before a meal.      insulin aspart protamine-insulin aspart (70-30) 100 UNIT/ML injection   Commonly known as: NOVOLOG 70/30   Inject 9 Units into the skin 2 (two) times daily with a meal.      lisinopril 2.5 MG tablet   Commonly known as: PRINIVIL,ZESTRIL   Take 1 tablet (2.5 mg total) by mouth at bedtime.      metFORMIN 500 MG tablet   Commonly known as: GLUCOPHAGE   Take 500 mg by mouth 2 (two) times daily with a meal.      metoCLOPramide 10 MG tablet   Commonly known as: REGLAN   Take 1 tablet (10 mg total) by mouth  3 (three) times daily before meals.      metoprolol tartrate 25 MG tablet   Commonly known as: LOPRESSOR   Take 0.5 tablets (12.5 mg total) by mouth 2 (two) times daily.      nitroGLYCERIN 0.4 MG SL tablet   Commonly known as: NITROSTAT   Place 1 tablet (0.4 mg total) under the tongue every 5 (  five) minutes as needed for chest pain.      psyllium 95 % Pack   Commonly known as: HYDROCIL/METAMUCIL   Take 1 packet by mouth daily as needed.      simvastatin 20 MG tablet   Commonly known as: ZOCOR   Take 1 tablet (20 mg total) by mouth every evening.      Ticagrelor 90 MG Tabs tablet   Commonly known as: BRILINTA   Take 1 tablet (90 mg total) by mouth 2 (two) times daily.           Follow-up Information    Follow up with Guiliana Shor W, MD. (our office will call with the date and time to see one Of Dr. Elissa Hefty PA/NP)    Contact information:   [facility] [address], [address] [place] [address] [phone]        Discharge Instructions:  Call [facility] if any bleeding, swelling or drainage at cath site.  May shower, no tub baths for 48 hours for groin sticks.   Take 1 NTG, under your tongue, while sitting.  If no relief of pain may repeat NTG, one tab every 5 minutes up to 3 tablets total over 15 minutes.  If no relief CALL 911.  If you have dizziness/lightheadness  while taking NTG, stop taking and call 911.        No lifting over 5 pounds for 2 weeks  Lifevest  No driving for 2 weeks.  Weigh daily, call Dr. Elissa Hefty office [phone]) if you gain more than 3 pounds in a day or 5 pounds in a week.  Heart Healthy, Diabetic Diet with no to minimal salt.  Check glucose levels before meals and at bedtime.Marland Kitchen Keep a record.    Take the record to primary MD and see primary MD for diabetes management. SignedLeone Brand 09/20/2011, 9:01 PM  ATTENDING ATTESTATION  I saw & examined the patient  the AM of discharge.  I agree with Corliss Blacker summary.   He seems to be getting stronger every day. No overt SSx of CHF & despite his BPs in 90s-100s, he is not in shock.  I do not think that we can titrate up his ACE-I or lasix any more for now. I will start a low dose BB prior to d/c this AM as his BP seems to have stabilized -- would titrate as OP.  Will keep on low dose Lasix - need to be ~net even. His UOP has not been recorded correctly   His glycemic control is much improved, even after changing to 70/30 insulin tonight for cost purposes. -- would continue reglan for now.  He will need to establish a PCP to continue to follow his DM, with current HgbA1c of ~13.  On PO hypoglycemic and Metformin + 70/30 Insulin - RN to demonstrate injections today (using Lovenox injection) Appreciate Diabetes coordinator assistance  He is medically ready for discharge today. He was fitted for LifeVest this yesterday. Provided he does fine after receiving the low dose BB this AM, he is fine for d/c with close f/u @ [facility]. He should get his Brilinta doses prior to discharge.   He is probably going to be living in [place] over the next few months, so we will arrange for him to be seen @ [facility] for initial f/u.  We need to ensure that he will be able to get his Brilinta -- if not would be best to  change to Plavix -- It would be best for him to be on DAPT for at least 1 year.  Continue statin - but would reduce to 40mg  for d/c  PAD has been asymptomatic to date -- can be addressed as OP if he has Sx.    Will need reassessment of EF with in ~3 months to determine improvement -- otherwise, he would benefit from primary prevention with AICD.    LOS: 7 days   Jazzmon Prindle W, M.D., M.S. [facility] [address] [place], [place]  [address]  [phone] Pager # [phone]  09/21/2011 1:17 PM

## 2011-09-20 NOTE — Progress Notes (Signed)
[facility]  DAILY PROGRESS NOTE  NAME: Scott Ingram  MRN: [id number]  DOB: May 16, 1955    ADMIT DATE: 09/13/2011  Patient Description   56 y.o. male with PMH below presented with weakness, N/V and Chest pressure radiating to his L arm. ECG in ER demonstrated InferoLateral STEMI (II, III, aVF, V4) with "evolving" biphasicTWI, Anteroseptal Q waves as well as TWI in V5-6.  He was hypotensive with SBPs in the 70s --> increased to 90s after ~2L IVF.  Taken to cath lab - 100% CTO of LAD, collateralized by 100% mid occluded RCA as Culprit lesion --> complex 2 site PCI. Required Dopamine infusion for several days, finally weaned.  EF 20-25 by LV Gram & Echo -- as expected Aterior scarring with akinesis, severe inferior Hypokinesis.   Subjective: Feels much better.  Ambulating in hall. Tolerated ACE-I again last PM  Objective: Vital signs in last 24 hours: Temp:  [98.7 F (37.1 C)-99.8 F (37.7 C)] 99.7 F (37.6 C) (07/07 0433) Pulse Rate:  [81-91] 81  (07/07 0433) Resp:  [16-20] 20  (07/07 0433) BP: (92-111)/(61-76) 92/61 mmHg (07/07 0433) SpO2:  [98 %-99 %] 98 % (07/07 0433) Weight change:  Last BM Date: 09/19/11 Intake/Output from previous day: +480 07/06 0701 - 07/07 0700 In: 240 [P.O.:240] Out: -  Intake/Output this shift:   PE: General appearance: alert, cooperative, appears stated age, no distress and very pleasant mood & affect Neck: no adenopathy, no carotid bruit, no JVD, supple, symmetrical, trachea midline and thyroid not enlarged, symmetric, no tenderness/mass/nodules Lungs: clear to auscultation bilaterally, normal percussion bilaterally and non-labored Heart: regular rate and rhythm, S1, S2 normal, S4 present and no M/R Abdomen: soft, non-tender; bowel sounds normal; no masses,  no organomegaly Extremities:no clubbing, cyanosis or edema Pulses: 2+ and symmetric Neurologic: Grossly normal   Lab Results:  Basename 09/19/11 0538  WBC  6.5  HGB 10.1*  HCT 29.7*  PLT 244   BMET  Basename 09/19/11 0538  NA 137  K 3.9  CL 103  CO2 24  GLUCOSE 149*  BUN 12  CREATININE 0.81  CALCIUM 8.5   No results found for this basename: TROPONINI:2,CK,MB:2 in the last 72 hours  Lab Results  Component Value Date   CHOL 157 09/14/2011   HDL 49 09/14/2011   LDLCALC 92 09/14/2011   TRIG 82 09/14/2011   CHOLHDL 3.2 09/14/2011   Lab Results  Component Value Date   HGBA1C 13.1* 09/14/2011     Studies/Results: No results found.  Medications: I have reviewed the patient's current medications.    Marland Kitchen aspirin  81 mg Oral Daily  . atorvastatin  80 mg Oral q1800  . docusate sodium  200 mg Oral BID  . enoxaparin (LOVENOX) injection  40 mg Subcutaneous Q24H  . furosemide  20 mg Oral Daily  . glipiZIDE  10 mg Oral BID AC  . insulin aspart  0-9 Units Subcutaneous TID WC  . insulin aspart protamine-insulin aspart  9 Units Subcutaneous BID WC  . lisinopril  2.5 mg Oral QHS  . metFORMIN  500 mg Oral BID WC  . metoCLOPramide  10 mg Oral TID WC & HS  . metoprolol succinate  12.5 mg Oral Daily  . pantoprazole  40 mg Oral Q0600  . psyllium  1 packet Oral Daily  . sodium chloride  3 mL Intravenous Q12H  . Ticagrelor  90 mg Oral BID  . DISCONTD: insulin glargine  10 Units Subcutaneous QHS  Assessment/Plan: Principal Problem:  *STEMI, RCA BMS 09/13/11 Active Problems:  Ischemic cardiomyopathy, EF 25%  at cath  Diabetes mellitus, uncontrolled  PVD (peripheral vascular disease), RCIA stenosis at cath  HTN (hypertension), now hypotensive post MI  Family history of coronary artery disease, pt's brother had MI at 65  At risk for sudden cardiac death, for Life vest until improvement of EF or ICD placement  He seems to be getting stronger every day.  No overt SSx of CHF & despite his BPs in 90s-100s, he is not in shock.    I do not think that we can titrate up his ACE-I or lasix any more for now.  I will start a low dose BB prior to d/c this AM  as his BP seems to have stabilized -- would titrate as OP.  Will keep on low dose Lasix - need to be ~net even.  His UOP has not been recorded correctly   His glycemic control is much improved, even after changing to 70/30 insulin tonight for cost purposes. -- would continue reglan for now.    He will need to establish a PCP to continue to follow his DM, with current HgbA1c of ~13.  On PO hypoglycemic and Metformin + 70/30 Insulin - RN to demonstrate injections today (using Lovenox injection)  Appreciate Diabetes coordinator assistance  He is close to being medically ready for discharge today. He was fitted for LifeVest this yesterday.  Provided he does fine after receiving the low dose BB this AM, he is fine for d/c with close f/u @ [facility]. He should get his Brilinta doses prior to discharge. He is probably going to be living in  over the next few months, so we will arrange for him to be seen @ [facility] for initial f/u.   We need to ensure that he will be able to get his Brilinta -- if not would be best to change to Plavix -- It would be best for him to be on DAPT for at least 1 year.  Continue statin - but would reduce to 40mg  for d/c  PAD has been asymptomatic to date -- can be addressed as OP if he has Sx. Will need reassessment of EF with in ~3 months to determine improvement -- otherwise, he would benefit from primary prevention with AICD.   LOS: 7 days   HARDING,DAVID W, M.D., M.S. [facility] [address] [place], [place]  [address]  [phone] Pager # [phone]  09/20/2011 7:51 AM

## 2011-09-20 NOTE — Progress Notes (Signed)
Patient ambulating in hallway with no problems or complaints. Demonstrated competence with injecting insulin. All questions answered. Patient to be discharged this afternoon. Scott Ingram, Chrystine Oiler

## 2011-09-20 NOTE — Progress Notes (Signed)
Pt. Discharged 09/20/2011  6:25 PM Discharge instructions reviewed with patient/family. Patient/family verbalized understanding. All Rx's given. 3 day supply of meds given to patient with a 30 day free card for Brilinta.  Life vest placed on patient.  Questions answered as needed. Pt. Discharged to home with family.  Lynnlee Revels, Chrystine Oiler

## 2011-12-22 HISTORY — PX: OTHER SURGICAL HISTORY: SHX169

## 2012-08-10 ENCOUNTER — Other Ambulatory Visit: Payer: Self-pay | Admitting: *Deleted

## 2012-08-10 MED ORDER — SIMVASTATIN 20 MG PO TABS: 20.0000 mg | ORAL_TABLET | Freq: Every evening | ORAL | Status: DC

## 2012-08-11 ENCOUNTER — Other Ambulatory Visit: Payer: Self-pay | Admitting: Cardiovascular Disease

## 2012-09-15 ENCOUNTER — Other Ambulatory Visit: Payer: Self-pay | Admitting: *Deleted

## 2012-09-15 MED ORDER — FUROSEMIDE 20 MG PO TABS: 20.0000 mg | ORAL_TABLET | Freq: Every day | ORAL | Status: DC

## 2012-09-15 NOTE — Telephone Encounter (Signed)
Rx refill sent to pharmacy electronically.

## 2012-10-24 ENCOUNTER — Other Ambulatory Visit: Payer: Self-pay | Admitting: *Deleted

## 2012-10-24 MED ORDER — FAMOTIDINE 20 MG PO TABS: 20.0000 mg | ORAL_TABLET | Freq: Two times a day (BID) | ORAL | Status: DC

## 2012-10-25 ENCOUNTER — Other Ambulatory Visit: Payer: Self-pay | Admitting: *Deleted

## 2012-10-25 MED ORDER — CLOPIDOGREL BISULFATE 75 MG PO TABS: 75.0000 mg | ORAL_TABLET | Freq: Every day | ORAL | Status: DC

## 2012-10-26 ENCOUNTER — Other Ambulatory Visit: Payer: Self-pay | Admitting: Cardiology

## 2012-10-27 NOTE — Telephone Encounter (Signed)
Rx request denied. Defer to PCP. Sent to pharmacy electronically.

## 2012-11-17 ENCOUNTER — Other Ambulatory Visit: Payer: Self-pay | Admitting: Cardiology

## 2013-01-18 ENCOUNTER — Ambulatory Visit (INDEPENDENT_AMBULATORY_CARE_PROVIDER_SITE_OTHER): Payer: Self-pay | Admitting: Cardiology

## 2013-01-18 ENCOUNTER — Encounter: Payer: Self-pay | Admitting: Cardiology

## 2013-01-18 VITALS — BP 128/74 | HR 89 | Ht 61.0 in | Wt 149.2 lb

## 2013-01-18 DIAGNOSIS — I739 Peripheral vascular disease, unspecified: Secondary | ICD-10-CM

## 2013-01-18 DIAGNOSIS — E785 Hyperlipidemia, unspecified: Secondary | ICD-10-CM

## 2013-01-18 DIAGNOSIS — I213 ST elevation (STEMI) myocardial infarction of unspecified site: Secondary | ICD-10-CM

## 2013-01-18 DIAGNOSIS — I2119 ST elevation (STEMI) myocardial infarction involving other coronary artery of inferior wall: Secondary | ICD-10-CM

## 2013-01-18 DIAGNOSIS — I2510 Atherosclerotic heart disease of native coronary artery without angina pectoris: Secondary | ICD-10-CM

## 2013-01-18 DIAGNOSIS — I255 Ischemic cardiomyopathy: Secondary | ICD-10-CM

## 2013-01-18 DIAGNOSIS — I219 Acute myocardial infarction, unspecified: Secondary | ICD-10-CM

## 2013-01-18 DIAGNOSIS — I2589 Other forms of chronic ischemic heart disease: Secondary | ICD-10-CM

## 2013-01-18 DIAGNOSIS — Z8249 Family history of ischemic heart disease and other diseases of the circulatory system: Secondary | ICD-10-CM

## 2013-01-18 DIAGNOSIS — I10 Essential (primary) hypertension: Secondary | ICD-10-CM

## 2013-01-18 MED ORDER — CLOPIDOGREL BISULFATE 75 MG PO TABS: 75.0000 mg | ORAL_TABLET | Freq: Every day | ORAL | Status: DC

## 2013-01-18 MED ORDER — METOPROLOL TARTRATE 25 MG PO TABS: 25.0000 mg | ORAL_TABLET | Freq: Two times a day (BID) | ORAL | Status: AC

## 2013-01-18 MED ORDER — SIMVASTATIN 20 MG PO TABS: ORAL_TABLET | ORAL | Status: AC

## 2013-01-18 MED ORDER — FAMOTIDINE 20 MG PO TABS: 20.0000 mg | ORAL_TABLET | Freq: Two times a day (BID) | ORAL | Status: AC

## 2013-01-18 MED ORDER — FUROSEMIDE 20 MG PO TABS: 20.0000 mg | ORAL_TABLET | Freq: Every day | ORAL | Status: AC

## 2013-01-18 MED ORDER — LISINOPRIL 5 MG PO TABS: 5.0000 mg | ORAL_TABLET | Freq: Every day | ORAL | Status: AC

## 2013-01-18 NOTE — Patient Instructions (Signed)
Your physician wants you to follow-up in 12 month Dr Herbie Baltimore.  You will receive a reminder letter in the mail two months in advance. If you don't receive a letter, please call our office to schedule the follow-up appointment.   Restart  METOPROLOL TART.  25 MG  TWICE A DAY. THE FIRST THREE WEEKS  TAKE 1/2 TABLET TWICE A DAY THEN ON THE 4th -5th week take 1 tablet in morning then 1/2 tablet in the evening then go to 1 tablet twice a day.

## 2013-01-19 ENCOUNTER — Encounter: Payer: Self-pay | Admitting: Cardiology

## 2013-01-19 DIAGNOSIS — E1151 Type 2 diabetes mellitus with diabetic peripheral angiopathy without gangrene: Secondary | ICD-10-CM | POA: Insufficient documentation

## 2013-01-19 DIAGNOSIS — E785 Hyperlipidemia, unspecified: Secondary | ICD-10-CM | POA: Insufficient documentation

## 2013-01-19 NOTE — Assessment & Plan Note (Signed)
This is now being monitored by his new physician. He is on statin. By his report the last results were relatively reassuring.

## 2013-01-19 NOTE — Assessment & Plan Note (Signed)
Very stable blood pressure control now. I think we need to get him back on his beta blocker plus low dose of ACE inhibitor.

## 2013-01-19 NOTE — Assessment & Plan Note (Signed)
Thankfully no recurrent heart failure or shock symptoms. As mentioned above, it would seem like his EF is probably higher than it was to his initial significant LVEDP elevation has clearly dropped  He is on minimal dose of Lasix and lisinopril. He has not even been on his metoprolol for last couple months because he ran out of it. I will simply refill her prescription and have gradually titrate back up to 25 mg twice a day.

## 2013-01-19 NOTE — Assessment & Plan Note (Addendum)
Amazingly, he has pre-significant residual disease with a LAD occlusion. However once the RCA was revascularized he had dramatic collaterals filling the entire LAD system including diagonal branches. He clearly has evidence on his echo of an old silent anterior MI.  The concerning thing is that his symptoms when he presented were really almost GI sounding in nature I spent quite a time talking to the son and explained to him as well as the patient how important it is to understand the symptoms he had at that his MI and to correlate those symptoms with possible cardiac etiology as opposed to simply just GI. He remains on dual immunotherapy. In light of the fact he is pretty extensive stents work in the very important RCA, on file and that as long as he has no significant bleeding. However with his recent diagnosis of anemia if needed we could stop either or both agents for a short period time. Would then simply use potentially Plavix over aspirin plus Plavix.

## 2013-01-19 NOTE — Progress Notes (Signed)
PATIENT: Scott Ingram MRN: [id number]  DOB: 1956/01/23   DOV:01/19/2013 PCP: No PCP Per Patient  Clinic Note: Chief Complaint  Patient presents with  . Annual Exam    no chest pain,-month ago pain rgt side of face but he went to pcp nothing was found  , no sob, no edema ,no energy to do heavy work as he did in the past  ;labs will be done [place] NOV 13,2014    HPI: Scott Ingram is a 56 y.o. male with a PMH below who presents today for followup from his MI ischemic cardiomyopathy. Now 57 y.o. Hispanic male with PMH of HTN & DM below presented (09/13/2011) with weakness, N/V and Chest pressure radiating to his L arm. ECG in ER demonstrated InferoLateral STEMI (II, III, aVF, V4) with "evolving" biphasicTWI, Anteroseptal Q waves as well as TWI in V5-6.  He was hypotensive with SBPs in the 70s --> increased to 90s after ~2L IVF.  Taken to cath lab - 100% CTO of LAD, collateralized by 100% mid occluded RCA as Culprit lesion --> complex 2 site PCI with BMS stents. Required Dopamine infusion for several days, finally weaned.  EF 20-25 by LV Gram & Echo -- as expected Aterior scarring with akinesis, severe inferior Hypokinesis. He was discharged with a life vest, and followup echocardiogram showed improvement EF is roughly 40%. He is continued to do well percent. He is socially move back to [place] area, and now has a primary care provider who is monitoring his lipids and diabetes. But he prefers to come back up here for his cardiology care. On his her he stays with his son comes in his interpreter. I last saw him a year ago. He was doing quite well. He had some pre-significant gastroparesis and was started on Reglan at the time.  Interval History: He comes in feeling quite well. He says his getting back to being active. He is trying to exercise on a daily basis. He says sometimes he can't do it because the weather, but is pretty diligent about exercising. He says it is not quite able to do  is able to before his MI but is getting close. He says if he really pushes it or strained as carrying something heavy he does note dyspnea. He has a little swelling but nothing significant. He was told by his primary provider that he had anemia, and was told to eat more red meat, but was not put on iron supplement. He has not had any symptoms of heart failure either from a low output standpoint or from CHF exacerbation standpoint. No PND, orthopnea or edema. Really none since his hospitalization. He also denies any of his anginal equivalent (which was nausea and heaviness in the epigastrium). He denies any rapid heartbeats, palpitations or irregular heartbeats. No lightheadedness, dizziness or wooziness. No syncope or near-syncope. No TIA or amaurosis symptoms. He remains on dual antiplatelet therapy because we only give him the annual prescription for Plavix [facility]. He denies any melena, hematochezia or hematuria -- however this will need to be monitored in the setting of his anemia.  Past Medical History  Diagnosis Date  . Diabetes mellitus type 2 with peripheral artery disease   . HTN (hypertension)   . Ischemic cardiomyopathy 09/13/11    Chronic LAD occlusion, now s/p InferoLateral STEMI - RCA occlusion;  EF ~25% -- EF improved to 35-40% in October 2013. Moderate to severely reduced EF.  Septal akinesis. Moderate to severe apical  hypokinesis. However the inferior wall appeared to be moving much better mild MR with significantly reduced probably pressures of 30-40 mmHg.  . ST elevation myocardial infarction (STEMI) of inferolateral wall 09/13/11    Presented with Cardiogenic shock; 2 site PCI of RCA; distal RCA system provides collaterals to the LAD  . PAD (peripheral artery disease) 09/13/11    Severe R Iliac Stenosis noted on cardiac catheterization  . Cardiogenic shock, secondary to acute MI, improved with Dopamine and fluids, resolved at discharge 09/20/2011  . Abnormal  LFTs 09/20/2011  . CAD (coronary artery disease), with residual 100% LAD stenosis and disease of the AV groove. 09/20/2011  . DM type 2 with diabetic dyslipidemia   . Dyslipidemia, goal LDL below 70    Prior Cardiac Evaluation and Past Surgical History: Past Surgical History  Procedure Laterality Date  . Cardiac catheterization  June 30th 2013    Proximal LAD occlusion. Mid RCA occlusion and a calcified vessel. Also moderate disease in the AV groove circumflex.; EF roughly 25%. Severely elevated LVEDP; cardiogenic shock  . Coronary angioplasty with stent placement  09/13/2011    To site of PCI (great difficult, complex procedure with calcified vessel) - distal RCA: 3.0 mm x 18 mm Integrity BMS (3.3 mm);  Proximal RCA: 3.5 mm x 20 mm Integrity BMS (3.7 mm distal and 4.2 mm prox  . Post-mi echo  09/14/2011    Moderate concentric LVH. Severely reduced function 20-25%. Akinesis of the anterior septum and apex consistent with prior LAD infarct also severe hypokinesis of the inferior and posterior walls. Severe LVEDP elevation. Mild MR. Moderate PA elevation.  . Followup echo  12/22/2011    EF 35-40%. Moderate-severely reduced EF. Septal akinesis and moderate to severe apical HK. Inferior wall motion was significantly improved. Mild MR. Slightly lower PA pressures.   No Known Allergies  Current Outpatient Prescriptions  Medication Sig Dispense Refill  . aspirin EC 81 MG tablet Take 81 mg by mouth daily.      . clopidogrel (PLAVIX) 75 MG tablet Take 1 tablet (75 mg total) by mouth daily.  180 tablet  1  . famotidine (PEPCID) 20 MG tablet Take 1 tablet (20 mg total) by mouth 2 (two) times daily.  180 tablet  1  . furosemide (LASIX) 20 MG tablet Take 1 tablet (20 mg total) by mouth daily.  180 tablet  1  . glipiZIDE (GLUCOTROL) 10 MG tablet Take 10 mg by mouth 2 (two) times daily before a meal.      . lisinopril (PRINIVIL,ZESTRIL) 5 MG tablet Take 1 tablet (5 mg total) by mouth daily.  180 tablet  1  .  metFORMIN (GLUCOPHAGE) 500 MG tablet Take 500 mg by mouth 2 (two) times daily with a meal.      . metoCLOPramide (REGLAN) 10 MG tablet Take 1 tablet (10 mg total) by mouth 3 (three) times daily before meals.  90 tablet  2  . nitroGLYCERIN (NITROSTAT) 0.4 MG SL tablet Place 1 tablet (0.4 mg total) under the tongue every 5 (five) minutes as needed for chest pain.  25 tablet  4  . simvastatin (ZOCOR) 20 MG tablet TAKE ONE TABLET BY MOUTH EVERY DAY IN THE EVENING  180 tablet  1  . insulin aspart protamine- aspart (NOVOLOG MIX 70/30) (70-30) 100 UNIT/ML injection Inject 9 Units into the skin 2 (two) times daily with a meal. prn      . metoprolol tartrate (LOPRESSOR) 25 MG tablet Take 1 tablet (25 mg  total) by mouth 2 (two) times daily.  360 tablet  1   No current facility-administered medications for this visit.    History   Social History Narrative   He is a married father of 5. One of his sons comes in with him as his interpreter. He is 3 grandchildren.  He now exercises almost daily.   He currently lives back in [place], [place], but returns here to stay with his son he care.   He never smoked, and does not drink alcohol.   ROS: A comprehensive Review of Systems - Negative except Mild osteoporosis pains. His GIA gastroparesis and has a significantly improved  PHYSICAL EXAM BP 128/74  Pulse 89  Ht 5\' 1"  (1.549 m)  Wt 149 lb 3.2 oz (67.677 kg)  BMI 28.21 kg/m2 General appearance: alert, cooperative, appears stated age, no distress and Relatively healthy-appearing. Well-nourished and well-groomed. Answers questions appropriately. Neck: no adenopathy, no carotid bruit, no JVD and supple, symmetrical, trachea midline Lungs: clear to auscultation bilaterally, normal percussion bilaterally and Nonlabored, good air movement Heart: regular rate and rhythm, S1, S2 normal, no murmur, click, rub or gallop and normal apical impulse Abdomen: soft, non-tender; bowel sounds normal; no masses,  no  organomegaly Extremities: extremities normal, atraumatic, no cyanosis or edema Pulses: 2+ pulses on the left with its slightly diminished 1+ pulses in the right foot Neurologic: Grossly normal  WUJ:[id number] today: Yes Rate: 89 , Rhythm: NSR, septal MI age-undetermined   Recent Labs: Checked by PCP. None since last year.  ASSESSMENT / PLAN: ST elevation myocardial infarction (STEMI) of inferolateral wall -- BMS x2 to RCA He is relatively significant inferior MI with a dominant RCA occlusion. This also provided full collateralization of the chronically occluded LAD. He had a long post MI course with cardiogenic shock and severe ischemic cardiomyopathy. Since his discharge, he is done extremely well.  He looks much healthier than he did before. His blood pressures and heart rate are stable. His EF is improved. Based mostly on her symptoms I would say that his EF may be higher than it was a year ago.  Ischemic cardiomyopathy, EF 25%  at cath; followup Echo October 2013 with EF 35-40% Thankfully no recurrent heart failure or shock symptoms. As mentioned above, it would seem like his EF is probably higher than it was to his initial significant LVEDP elevation has clearly dropped  He is on minimal dose of Lasix and lisinopril. He has not even been on his metoprolol for last couple months because he ran out of it. I will simply refill her prescription and have gradually titrate back up to 25 mg twice a day.  CAD (coronary artery disease), with residual 100% LAD stenosis and disease of the AV groove. Amazingly, he has pre-significant residual disease with a LAD occlusion. However once the RCA was revascularized he had dramatic collaterals filling the entire LAD system including diagonal branches. He clearly has evidence on his echo of an old silent anterior MI.  The concerning thing is that his symptoms when he presented were really almost GI sounding in nature I spent quite a time talking to the son  and explained to him as well as the patient how important it is to understand the symptoms he had at that his MI and to correlate those symptoms with possible cardiac etiology as opposed to simply just GI. He remains on dual immunotherapy. In light of the fact he is pretty extensive stents work in the very important RCA, on  file and that as long as he has no significant bleeding. However with his recent diagnosis of anemia if needed we could stop either or both agents for a short period time. Would then simply use potentially Plavix over aspirin plus Plavix.  PVD (peripheral vascular disease), RCIA stenosis at cath In addition to a significant coronary disease he also had pretty significant aortoiliac disease noted during his catheterization. Interestingly enough he has no really significant claudication symptoms and therefore has not had any further evaluation. If he were to note some claudication symptoms perhaps a percutaneous options may be available.  HTN (hypertension) Very stable blood pressure control now. I think we need to get him back on his beta blocker plus low dose of ACE inhibitor.  Dyslipidemia, goal LDL below 70 This is now being monitored by his new physician. He is on statin. By his report the last results were relatively reassuring.    Orders Placed This Encounter  Procedures  . EKG 12-Lead   Refills Meds ordered this encounter  Medications  . DISCONTD: lisinopril (PRINIVIL,ZESTRIL) 5 MG tablet    Sig: Take 5 mg by mouth daily.  Marland Kitchen aspirin EC 81 MG tablet    Sig: Take 81 mg by mouth daily.  . insulin aspart protamine- aspart (NOVOLOG MIX 70/30) (70-30) 100 UNIT/ML injection    Sig: Inject 9 Units into the skin 2 (two) times daily with a meal. prn  . lisinopril (PRINIVIL,ZESTRIL) 5 MG tablet    Sig: Take 1 tablet (5 mg total) by mouth daily.    Dispense:  180 tablet    Refill:  1  . furosemide (LASIX) 20 MG tablet    Sig: Take 1 tablet (20 mg total) by mouth daily.     Dispense:  180 tablet    Refill:  1  . famotidine (PEPCID) 20 MG tablet    Sig: Take 1 tablet (20 mg total) by mouth 2 (two) times daily.    Dispense:  180 tablet    Refill:  1  . clopidogrel (PLAVIX) 75 MG tablet    Sig: Take 1 tablet (75 mg total) by mouth daily.    Dispense:  180 tablet    Refill:  1  . simvastatin (ZOCOR) 20 MG tablet    Sig: TAKE ONE TABLET BY MOUTH EVERY DAY IN THE EVENING    Dispense:  180 tablet    Refill:  1  . metoprolol tartrate (LOPRESSOR) 25 MG tablet    Sig: Take 1 tablet (25 mg total) by mouth 2 (two) times daily.    Dispense:  360 tablet    Refill:  1    Followup: One year  Darleny Sem W. Herbie Baltimore, M.D., M.S. [facility] [address] [place], [place]  [address]  [phone] Pager # [phone]

## 2013-01-19 NOTE — Assessment & Plan Note (Signed)
He is relatively significant inferior MI with a dominant RCA occlusion. This also provided full collateralization of the chronically occluded LAD. He had a long post MI course with cardiogenic shock and severe ischemic cardiomyopathy. Since his discharge, he is done extremely well.  He looks much healthier than he did before. His blood pressures and heart rate are stable. His EF is improved. Based mostly on her symptoms I would say that his EF may be higher than it was a year ago.

## 2013-01-19 NOTE — Assessment & Plan Note (Signed)
In addition to a significant coronary disease he also had pretty significant aortoiliac disease noted during his catheterization. Interestingly enough he has no really significant claudication symptoms and therefore has not had any further evaluation. If he were to note some claudication symptoms perhaps a percutaneous options may be available.

## 2013-01-25 ENCOUNTER — Telehealth: Payer: Self-pay | Admitting: Cardiology

## 2013-01-25 NOTE — Telephone Encounter (Signed)
Patient was seen Wednesday a week ago and was prescribed metoclopramide 10 mg and was not called into pharmacy.  Got all other meds.  Please call

## 2013-01-25 NOTE — Telephone Encounter (Signed)
Will defer to Jasmine December, RN as pt is rx'd famotidine as well.

## 2013-01-26 MED ORDER — METOCLOPRAMIDE HCL 10 MG PO TABS: 10.0000 mg | ORAL_TABLET | Freq: Three times a day (TID) | ORAL | Status: AC

## 2013-01-26 NOTE — Telephone Encounter (Signed)
Spoke with Dutch Quint. He stated that the patient is taking metoclopramide 10 mg tid. RN informed Dutch Quint that at last visit both ,he and patient stated that the patient was not taking the medication.Dutch Quint stated that they misunderstood and the patient is taking the medication.  Prescription will be sent into the pharmacy,but only for 3 months--will need doctor in [place] to refill after this refill. Dutch Quint voiced understanding.

## 2013-03-23 ENCOUNTER — Other Ambulatory Visit: Payer: Self-pay | Admitting: *Deleted

## 2013-03-23 MED ORDER — CLOPIDOGREL BISULFATE 75 MG PO TABS: 75.0000 mg | ORAL_TABLET | Freq: Every day | ORAL | Status: DC

## 2013-03-23 NOTE — Telephone Encounter (Signed)
Rx was sent to pharmacy electronically. 

## 2013-07-16 ENCOUNTER — Other Ambulatory Visit: Payer: Self-pay | Admitting: Cardiology

## 2013-07-18 NOTE — Telephone Encounter (Signed)
Refused refill. Patient needs to contact PRIMARY in [place]. RN had a conversation with son Colon BranchCarson after last office visit,due to the patient not understanding English well.  Future medication would need to come from PRIMARY.Carson voiced understanding.

## 2013-07-18 NOTE — Telephone Encounter (Signed)
Need paper chart  

## 2013-10-23 IMAGING — CR DG CHEST 1V PORT
1 series · 1 of 1 positions shown · non-contrast
Comparison: None.

CLINICAL DATA: Myocardial infarction.  Hypertension.  Diabetes.

PORTABLE CHEST - 1 VIEW

[AP]
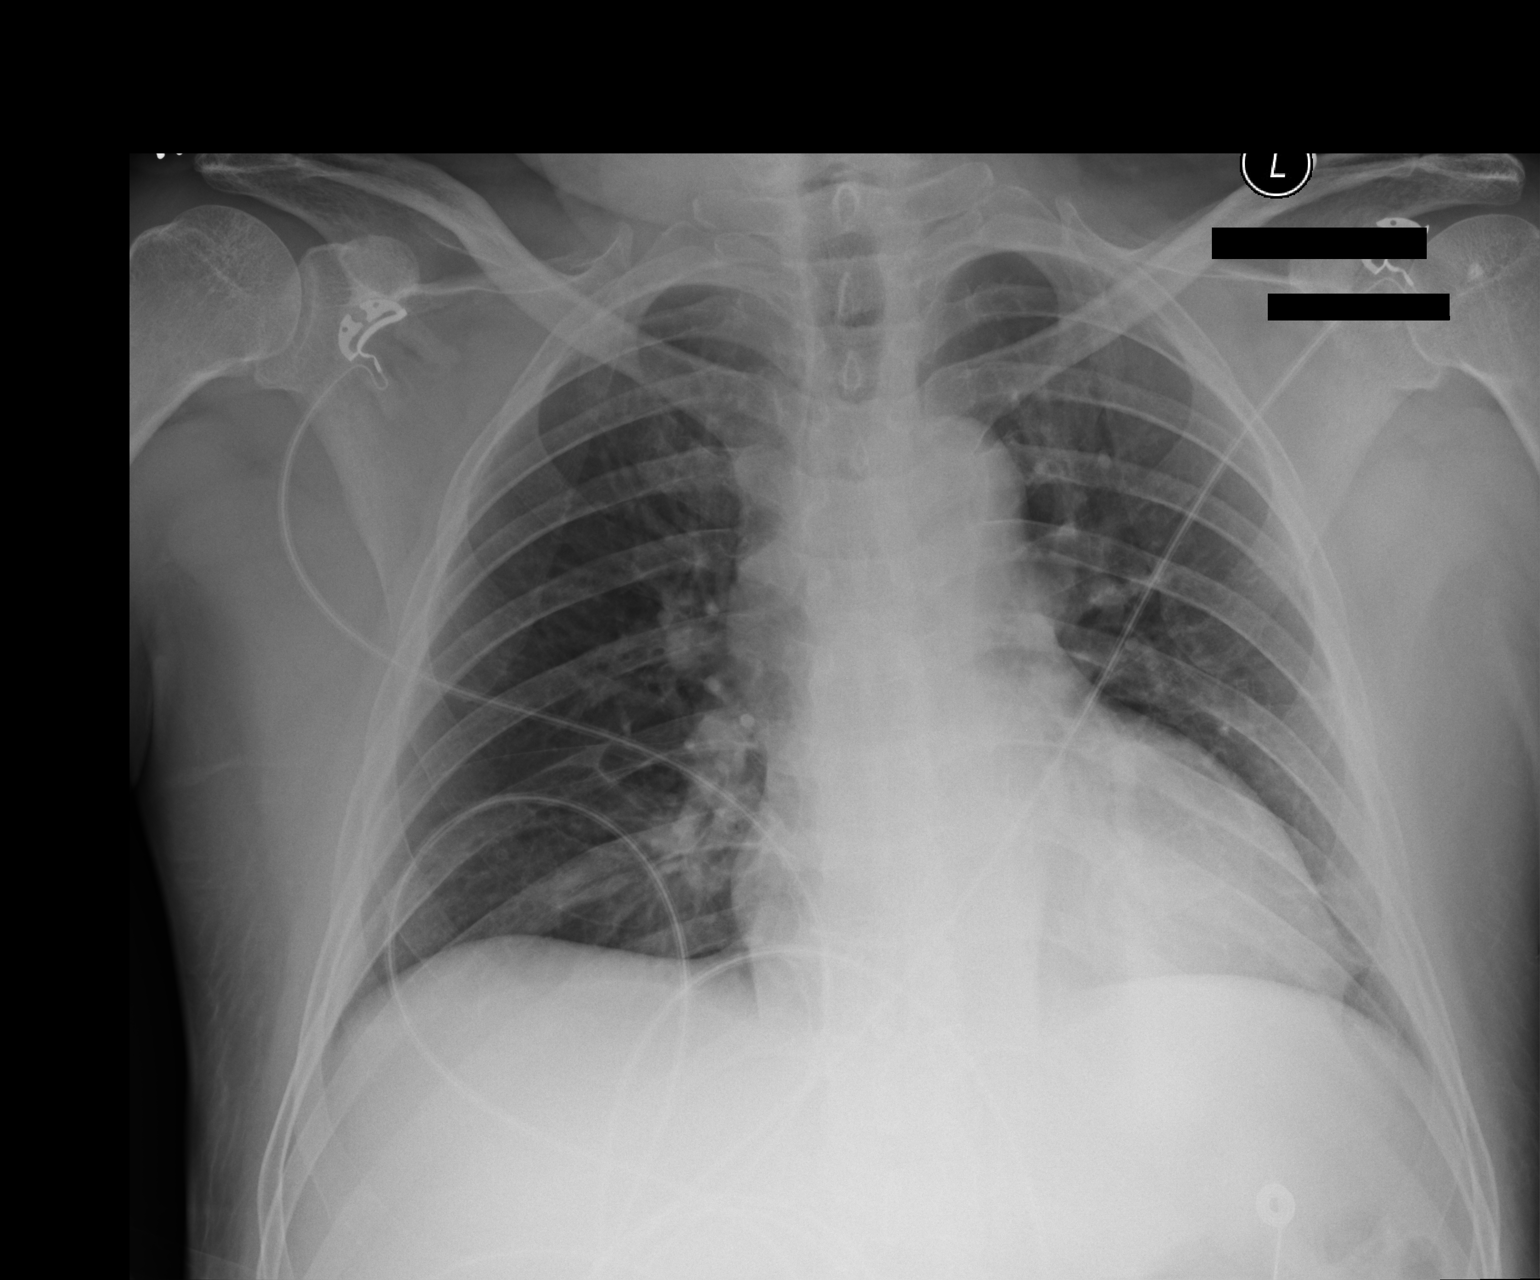

[1 of 1 positions shown; findings below may reference images not displayed]

FINDINGS: Mildly enlarged cardiac silhouette.  Clear lungs with
normal vascularity.  Left humeral head bone island.
IMPRESSION: Mild cardiomegaly.  No acute abnormality.

## 2014-02-22 ENCOUNTER — Encounter (HOSPITAL_COMMUNITY): Payer: Self-pay | Admitting: Cardiology

## 2014-04-16 ENCOUNTER — Other Ambulatory Visit: Payer: Self-pay | Admitting: Cardiology

## 2014-04-16 NOTE — Telephone Encounter (Signed)
Rx refill sent to patient pharmacy   

## 2014-10-02 ENCOUNTER — Encounter: Payer: Self-pay | Admitting: Cardiology
